# Patient Record
Sex: Female | Born: 1935
Health system: Southern US, Community
[De-identification: ages and names within clinical notes are randomized; demographics above are authoritative.]

## PROBLEM LIST (undated history)

## (undated) DIAGNOSIS — I10 Essential (primary) hypertension: Secondary | ICD-10-CM

## (undated) HISTORY — PX: CHOLECYSTECTOMY: SHX55

## (undated) HISTORY — PX: MASTECTOMY: SHX3

---

## 2005-06-22 ENCOUNTER — Ambulatory Visit: Payer: Self-pay | Admitting: Cardiology

## 2009-03-01 ENCOUNTER — Ambulatory Visit: Payer: Self-pay | Admitting: Cardiology

## 2009-04-02 ENCOUNTER — Ambulatory Visit: Payer: Self-pay | Admitting: Cardiology

## 2009-09-16 DIAGNOSIS — E785 Hyperlipidemia, unspecified: Secondary | ICD-10-CM

## 2009-09-16 DIAGNOSIS — E875 Hyperkalemia: Secondary | ICD-10-CM

## 2009-09-16 DIAGNOSIS — I10 Essential (primary) hypertension: Secondary | ICD-10-CM

## 2009-09-16 DIAGNOSIS — R002 Palpitations: Secondary | ICD-10-CM

## 2011-04-26 NOTE — Assessment & Plan Note (Signed)
[facility]                          [name] OFFICE NOTE   Michelle Sharp, Michelle Sharp                       MRN:          [id number]  DATE:04/02/2009                            DOB:          28-Feb-1926    PRIMARY CARDIOLOGIST:  Jonelle Sidle, MD   REASON FOR VISIT:  Post hospital followup.   Michelle Sharp was recently referred to [place] in consultation here at [facility], following presentation with new onset fluttering.  She had  been previously evaluated by [place] in 2005, at which time she was felt to  have either ectopic atrial tachycardia versus atypical flutter (2:1).   During this current evaluation, we recommended further evaluation as an  outpatient with a 2-D echocardiogram and a CardioNet monitor.  Echocardiography indicated preserved LVF, but with mild RV dysfunction  and mild pulmonary hypertension (RVSP 49 mmHg).   A CardioNet monitor was also completed, indicating normal sinus rhythm  with no obvious dysrhythmias.   Clinically, Michelle Sharp reports no further fluttering since discharge,  and otherwise states that she feels fine.   CURRENT MEDICATIONS:  Lopressor 100 mg q. a.m./50 mg q. p.m., aspirin 81  daily, Xanax 0.25 b.i.d., Aciphex and Exforge 350 daily.   PHYSICAL EXAMINATION:  VITAL SIGNS:  Blood pressure 166/81, pulse 60,  regular weight 155.6.  GENERAL:  An 77 year old female sitting upright in no distress.  HEENT:  Normocephalic, atraumatic.  NECK:  Palpable, bilateral carotid pulses without bruits; no JVD.  LUNGS:  Clear to auscultation in all fields.  HEART:  Regular rate and rhythm.  No significant murmurs.  No rubs.  ABDOMEN:  Benign.  EXTREMITIES:  No significant edema.  NEURO:  Alert and oriented.   IMPRESSION:  1. Status post tachy palpitations, resolved.      a.     History of ectopic atrial tachycardia versus atypical       flutter, in 2005.      b.     No documented dysrhythmia by current CardioNet  monitoring.  2. Normal left ventricular function.      a.     Mild right ventricular dysfunction.  3. Hypertension, controlled.  4. Mixed dyslipidemia.  5. Mildly decreased TSH.  6:  Mild hypokalemia.   PLAN:  1. No further cardiac workup indicated at this point in time.  As      before, Michelle Sharp has been instructed to take an additional 25 mg      tablet of Lopressor, in Michelle event she were to have persistent tachy      palpitations.  2. Check a repeat TSH level, as well as a baseline metabolic profile.      Michelle Sharp did have a recent TSH level which was mildly decreased      at 0.32.  She has no known history of hyperthyroidism.  3. Continue close monitoring and management of hypertension, per Dr.      Sherril Croon.  4. Schedule return clinic followup with myself and Dr. Diona Browner in 6      months, or sooner if needed.  Rozell Searing, PA-C  Electronically Signed      Jonelle Sidle, MD  Electronically Signed   GS/MedQ  DD: 04/02/2009  DT: 04/03/2009  Job #: [id number]   cc:   Doreen Beam, MD

## 2016-01-22 DIAGNOSIS — M1990 Unspecified osteoarthritis, unspecified site: Secondary | ICD-10-CM | POA: Diagnosis not present

## 2016-01-22 DIAGNOSIS — R600 Localized edema: Secondary | ICD-10-CM | POA: Diagnosis not present

## 2016-01-22 DIAGNOSIS — I10 Essential (primary) hypertension: Secondary | ICD-10-CM | POA: Diagnosis not present

## 2016-01-22 DIAGNOSIS — F0390 Unspecified dementia without behavioral disturbance: Secondary | ICD-10-CM | POA: Diagnosis not present

## 2016-02-25 DIAGNOSIS — F0390 Unspecified dementia without behavioral disturbance: Secondary | ICD-10-CM | POA: Diagnosis not present

## 2016-02-25 DIAGNOSIS — I10 Essential (primary) hypertension: Secondary | ICD-10-CM | POA: Diagnosis not present

## 2016-02-25 DIAGNOSIS — M7989 Other specified soft tissue disorders: Secondary | ICD-10-CM | POA: Diagnosis not present

## 2016-02-25 DIAGNOSIS — Z789 Other specified health status: Secondary | ICD-10-CM | POA: Diagnosis not present

## 2016-02-26 DIAGNOSIS — Z853 Personal history of malignant neoplasm of breast: Secondary | ICD-10-CM | POA: Diagnosis not present

## 2016-02-26 DIAGNOSIS — R600 Localized edema: Secondary | ICD-10-CM | POA: Diagnosis not present

## 2016-02-26 DIAGNOSIS — M79662 Pain in left lower leg: Secondary | ICD-10-CM | POA: Diagnosis not present

## 2016-02-26 DIAGNOSIS — M79605 Pain in left leg: Secondary | ICD-10-CM | POA: Diagnosis not present

## 2016-03-16 DIAGNOSIS — R600 Localized edema: Secondary | ICD-10-CM | POA: Diagnosis not present

## 2016-04-29 DIAGNOSIS — I10 Essential (primary) hypertension: Secondary | ICD-10-CM | POA: Diagnosis not present

## 2016-04-29 DIAGNOSIS — M159 Polyosteoarthritis, unspecified: Secondary | ICD-10-CM | POA: Diagnosis not present

## 2016-04-29 DIAGNOSIS — G309 Alzheimer's disease, unspecified: Secondary | ICD-10-CM | POA: Diagnosis not present

## 2016-05-20 DIAGNOSIS — L309 Dermatitis, unspecified: Secondary | ICD-10-CM | POA: Diagnosis not present

## 2016-05-20 DIAGNOSIS — M7989 Other specified soft tissue disorders: Secondary | ICD-10-CM | POA: Diagnosis not present

## 2016-05-20 DIAGNOSIS — R42 Dizziness and giddiness: Secondary | ICD-10-CM | POA: Diagnosis not present

## 2016-06-06 DIAGNOSIS — M159 Polyosteoarthritis, unspecified: Secondary | ICD-10-CM | POA: Diagnosis not present

## 2016-06-06 DIAGNOSIS — G309 Alzheimer's disease, unspecified: Secondary | ICD-10-CM | POA: Diagnosis not present

## 2016-06-06 DIAGNOSIS — I10 Essential (primary) hypertension: Secondary | ICD-10-CM | POA: Diagnosis not present

## 2016-06-22 DIAGNOSIS — Z6824 Body mass index (BMI) 24.0-24.9, adult: Secondary | ICD-10-CM | POA: Diagnosis not present

## 2016-06-22 DIAGNOSIS — M1990 Unspecified osteoarthritis, unspecified site: Secondary | ICD-10-CM | POA: Diagnosis not present

## 2016-06-22 DIAGNOSIS — Z299 Encounter for prophylactic measures, unspecified: Secondary | ICD-10-CM | POA: Diagnosis not present

## 2016-06-22 DIAGNOSIS — F0390 Unspecified dementia without behavioral disturbance: Secondary | ICD-10-CM | POA: Diagnosis not present

## 2016-06-22 DIAGNOSIS — I10 Essential (primary) hypertension: Secondary | ICD-10-CM | POA: Diagnosis not present

## 2016-06-29 DIAGNOSIS — G309 Alzheimer's disease, unspecified: Secondary | ICD-10-CM | POA: Diagnosis not present

## 2016-06-29 DIAGNOSIS — I10 Essential (primary) hypertension: Secondary | ICD-10-CM | POA: Diagnosis not present

## 2016-06-29 DIAGNOSIS — M159 Polyosteoarthritis, unspecified: Secondary | ICD-10-CM | POA: Diagnosis not present

## 2016-07-06 DIAGNOSIS — Z299 Encounter for prophylactic measures, unspecified: Secondary | ICD-10-CM | POA: Diagnosis not present

## 2016-07-06 DIAGNOSIS — Z1389 Encounter for screening for other disorder: Secondary | ICD-10-CM | POA: Diagnosis not present

## 2016-07-06 DIAGNOSIS — Z7189 Other specified counseling: Secondary | ICD-10-CM | POA: Diagnosis not present

## 2016-07-06 DIAGNOSIS — Z1211 Encounter for screening for malignant neoplasm of colon: Secondary | ICD-10-CM | POA: Diagnosis not present

## 2016-07-06 DIAGNOSIS — Z0000 Encounter for general adult medical examination without abnormal findings: Secondary | ICD-10-CM | POA: Diagnosis not present

## 2016-07-07 DIAGNOSIS — Z79899 Other long term (current) drug therapy: Secondary | ICD-10-CM | POA: Diagnosis not present

## 2016-07-07 DIAGNOSIS — R5383 Other fatigue: Secondary | ICD-10-CM | POA: Diagnosis not present

## 2016-07-07 DIAGNOSIS — E7800 Pure hypercholesterolemia, unspecified: Secondary | ICD-10-CM | POA: Diagnosis not present

## 2016-07-19 DIAGNOSIS — I10 Essential (primary) hypertension: Secondary | ICD-10-CM | POA: Diagnosis not present

## 2016-07-19 DIAGNOSIS — G309 Alzheimer's disease, unspecified: Secondary | ICD-10-CM | POA: Diagnosis not present

## 2016-07-19 DIAGNOSIS — M159 Polyosteoarthritis, unspecified: Secondary | ICD-10-CM | POA: Diagnosis not present

## 2016-09-06 DIAGNOSIS — G309 Alzheimer's disease, unspecified: Secondary | ICD-10-CM | POA: Diagnosis not present

## 2016-09-06 DIAGNOSIS — I10 Essential (primary) hypertension: Secondary | ICD-10-CM | POA: Diagnosis not present

## 2016-09-06 DIAGNOSIS — M159 Polyosteoarthritis, unspecified: Secondary | ICD-10-CM | POA: Diagnosis not present

## 2016-09-16 DIAGNOSIS — I10 Essential (primary) hypertension: Secondary | ICD-10-CM | POA: Diagnosis not present

## 2016-09-16 DIAGNOSIS — Z299 Encounter for prophylactic measures, unspecified: Secondary | ICD-10-CM | POA: Diagnosis not present

## 2016-09-16 DIAGNOSIS — F0390 Unspecified dementia without behavioral disturbance: Secondary | ICD-10-CM | POA: Diagnosis not present

## 2016-10-10 DIAGNOSIS — M159 Polyosteoarthritis, unspecified: Secondary | ICD-10-CM | POA: Diagnosis not present

## 2016-10-10 DIAGNOSIS — I10 Essential (primary) hypertension: Secondary | ICD-10-CM | POA: Diagnosis not present

## 2016-10-10 DIAGNOSIS — G309 Alzheimer's disease, unspecified: Secondary | ICD-10-CM | POA: Diagnosis not present

## 2016-11-30 DIAGNOSIS — G309 Alzheimer's disease, unspecified: Secondary | ICD-10-CM | POA: Diagnosis not present

## 2016-11-30 DIAGNOSIS — I10 Essential (primary) hypertension: Secondary | ICD-10-CM | POA: Diagnosis not present

## 2016-11-30 DIAGNOSIS — M159 Polyosteoarthritis, unspecified: Secondary | ICD-10-CM | POA: Diagnosis not present

## 2016-12-16 DIAGNOSIS — R5383 Other fatigue: Secondary | ICD-10-CM | POA: Diagnosis not present

## 2016-12-16 DIAGNOSIS — Z6825 Body mass index (BMI) 25.0-25.9, adult: Secondary | ICD-10-CM | POA: Diagnosis not present

## 2016-12-16 DIAGNOSIS — Z299 Encounter for prophylactic measures, unspecified: Secondary | ICD-10-CM | POA: Diagnosis not present

## 2016-12-16 DIAGNOSIS — I10 Essential (primary) hypertension: Secondary | ICD-10-CM | POA: Diagnosis not present

## 2016-12-16 DIAGNOSIS — M1710 Unilateral primary osteoarthritis, unspecified knee: Secondary | ICD-10-CM | POA: Diagnosis not present

## 2016-12-28 DIAGNOSIS — G309 Alzheimer's disease, unspecified: Secondary | ICD-10-CM | POA: Diagnosis not present

## 2016-12-28 DIAGNOSIS — I10 Essential (primary) hypertension: Secondary | ICD-10-CM | POA: Diagnosis not present

## 2016-12-28 DIAGNOSIS — M159 Polyosteoarthritis, unspecified: Secondary | ICD-10-CM | POA: Diagnosis not present

## 2017-02-09 DIAGNOSIS — Z299 Encounter for prophylactic measures, unspecified: Secondary | ICD-10-CM | POA: Diagnosis not present

## 2017-02-09 DIAGNOSIS — Z87891 Personal history of nicotine dependence: Secondary | ICD-10-CM | POA: Diagnosis not present

## 2017-02-09 DIAGNOSIS — R21 Rash and other nonspecific skin eruption: Secondary | ICD-10-CM | POA: Diagnosis not present

## 2017-02-09 DIAGNOSIS — E039 Hypothyroidism, unspecified: Secondary | ICD-10-CM | POA: Diagnosis not present

## 2017-02-09 DIAGNOSIS — Z713 Dietary counseling and surveillance: Secondary | ICD-10-CM | POA: Diagnosis not present

## 2017-02-09 DIAGNOSIS — E663 Overweight: Secondary | ICD-10-CM | POA: Diagnosis not present

## 2017-02-09 DIAGNOSIS — M545 Low back pain: Secondary | ICD-10-CM | POA: Diagnosis not present

## 2017-02-09 DIAGNOSIS — R5383 Other fatigue: Secondary | ICD-10-CM | POA: Diagnosis not present

## 2017-02-09 DIAGNOSIS — I10 Essential (primary) hypertension: Secondary | ICD-10-CM | POA: Diagnosis not present

## 2017-02-25 DIAGNOSIS — I214 Non-ST elevation (NSTEMI) myocardial infarction: Secondary | ICD-10-CM | POA: Diagnosis not present

## 2017-02-25 DIAGNOSIS — Z853 Personal history of malignant neoplasm of breast: Secondary | ICD-10-CM | POA: Diagnosis not present

## 2017-02-25 DIAGNOSIS — R7989 Other specified abnormal findings of blood chemistry: Secondary | ICD-10-CM | POA: Diagnosis not present

## 2017-02-25 DIAGNOSIS — Z79899 Other long term (current) drug therapy: Secondary | ICD-10-CM | POA: Diagnosis not present

## 2017-02-25 DIAGNOSIS — R109 Unspecified abdominal pain: Secondary | ICD-10-CM | POA: Diagnosis not present

## 2017-02-25 DIAGNOSIS — R252 Cramp and spasm: Secondary | ICD-10-CM | POA: Diagnosis not present

## 2017-02-25 DIAGNOSIS — Z7982 Long term (current) use of aspirin: Secondary | ICD-10-CM | POA: Diagnosis not present

## 2017-02-25 DIAGNOSIS — K219 Gastro-esophageal reflux disease without esophagitis: Secondary | ICD-10-CM | POA: Diagnosis not present

## 2017-02-25 DIAGNOSIS — D72829 Elevated white blood cell count, unspecified: Secondary | ICD-10-CM | POA: Diagnosis not present

## 2017-02-25 DIAGNOSIS — R918 Other nonspecific abnormal finding of lung field: Secondary | ICD-10-CM | POA: Diagnosis not present

## 2017-02-25 DIAGNOSIS — Z9011 Acquired absence of right breast and nipple: Secondary | ICD-10-CM | POA: Diagnosis not present

## 2017-02-25 DIAGNOSIS — I10 Essential (primary) hypertension: Secondary | ICD-10-CM | POA: Diagnosis not present

## 2017-02-25 DIAGNOSIS — Z9049 Acquired absence of other specified parts of digestive tract: Secondary | ICD-10-CM | POA: Diagnosis not present

## 2017-02-25 DIAGNOSIS — F0390 Unspecified dementia without behavioral disturbance: Secondary | ICD-10-CM | POA: Diagnosis not present

## 2017-02-25 DIAGNOSIS — R072 Precordial pain: Secondary | ICD-10-CM | POA: Diagnosis not present

## 2017-02-26 ENCOUNTER — Inpatient Hospital Stay (HOSPITAL_COMMUNITY)
Admission: AD | Admit: 2017-02-26 | Discharge: 2017-03-02 | DRG: 282 | Disposition: A | Discharge status: Home Health | Payer: Medicare Other | Source: Other Acute Inpatient Hospital | Attending: Cardiovascular Disease | Admitting: Cardiovascular Disease

## 2017-02-26 DIAGNOSIS — Z66 Do not resuscitate: Secondary | ICD-10-CM | POA: Diagnosis present

## 2017-02-26 DIAGNOSIS — K219 Gastro-esophageal reflux disease without esophagitis: Secondary | ICD-10-CM | POA: Diagnosis present

## 2017-02-26 DIAGNOSIS — R000 Tachycardia, unspecified: Secondary | ICD-10-CM | POA: Diagnosis not present

## 2017-02-26 DIAGNOSIS — Z79899 Other long term (current) drug therapy: Secondary | ICD-10-CM

## 2017-02-26 DIAGNOSIS — I10 Essential (primary) hypertension: Secondary | ICD-10-CM | POA: Diagnosis present

## 2017-02-26 DIAGNOSIS — F0390 Unspecified dementia without behavioral disturbance: Secondary | ICD-10-CM | POA: Diagnosis present

## 2017-02-26 DIAGNOSIS — R079 Chest pain, unspecified: Secondary | ICD-10-CM | POA: Diagnosis not present

## 2017-02-26 DIAGNOSIS — I214 Non-ST elevation (NSTEMI) myocardial infarction: Secondary | ICD-10-CM | POA: Diagnosis present

## 2017-02-26 DIAGNOSIS — R918 Other nonspecific abnormal finding of lung field: Secondary | ICD-10-CM | POA: Diagnosis present

## 2017-02-26 DIAGNOSIS — D72829 Elevated white blood cell count, unspecified: Secondary | ICD-10-CM | POA: Diagnosis not present

## 2017-02-26 HISTORY — DX: Essential (primary) hypertension: I10

## 2017-02-26 MED ORDER — ACETAMINOPHEN 325 MG PO TABS: 650.0000 mg | ORAL_TABLET | ORAL | Status: DC | PRN

## 2017-02-26 MED ORDER — ASPIRIN EC 81 MG PO TBEC
81.0000 mg | DELAYED_RELEASE_TABLET | Freq: Every day | ORAL | Status: DC
  Administered 2017-02-27 – 2017-03-02 (×4): 81 mg via ORAL
  Filled 2017-02-26 (×4): qty 1

## 2017-02-26 MED ORDER — ONDANSETRON HCL 4 MG/2ML IJ SOLN: 4.0000 mg | Freq: Four times a day (QID) | INTRAMUSCULAR | Status: DC | PRN

## 2017-02-26 MED ORDER — METOPROLOL TARTRATE 12.5 MG HALF TABLET
12.5000 mg | ORAL_TABLET | Freq: Two times a day (BID) | ORAL | Status: DC
  Administered 2017-02-26: 12.5 mg via ORAL
  Filled 2017-02-26: qty 1

## 2017-02-26 MED ORDER — NITROGLYCERIN 0.4 MG SL SUBL: 0.4000 mg | SUBLINGUAL_TABLET | SUBLINGUAL | Status: DC | PRN

## 2017-02-26 MED ORDER — RISPERIDONE 0.5 MG PO TBDP
0.2500 mg | ORAL_TABLET | Freq: Every day | ORAL | Status: DC
  Administered 2017-02-26 – 2017-03-01 (×4): 0.25 mg via ORAL
  Filled 2017-02-26 (×6): qty 0.5

## 2017-02-26 MED ORDER — ATORVASTATIN CALCIUM 40 MG PO TABS
40.0000 mg | ORAL_TABLET | Freq: Every day | ORAL | Status: DC
  Administered 2017-02-27 – 2017-03-01 (×3): 40 mg via ORAL
  Filled 2017-02-26 (×3): qty 1

## 2017-02-26 MED ORDER — PANTOPRAZOLE SODIUM 20 MG PO TBEC
20.0000 mg | DELAYED_RELEASE_TABLET | Freq: Every day | ORAL | Status: DC
  Administered 2017-02-27 – 2017-03-02 (×4): 20 mg via ORAL
  Filled 2017-02-26 (×4): qty 1

## 2017-02-26 NOTE — H&P (Signed)
Patient ID: Michelle Sharp MRN: [id number], DOB/AGE: 81/02/1926   Admit date: 02/26/2017  Requesting Physician: Primary Physician: Pcp Not In System Primary Cardiologist: N/A Reason for admission: NSTEMI  HPI:  Michelle Sharp is a 83 y.o. female with a history significant for HTN, GERD, and dementia who was accepted by Dr. Chari ManningMcneal as a transfer [facility] of [facility].  Michelle Sharp is very pleasant but her baseline dementia does not allow her to answer questions about her presentation right now, she does not know where she is or why she is here.  Unfortunately family is not at the bedside either, and so what follows was obtained via review of OSH documentation.  The pt presented to the OSH after reporting to her family that she was experiencing mild chest pain that was radiating to her back and shoulder.  She was found to be hemodynamically stable on arrival there and CT of the chest r/o dissection, but the physicians evaluating her were concerned for possible ischemic changes on her ECG and initial troponin was mildly elevated so medical management of NSTEMI with ASA and enoxaparin was started.  Troponin subsequently rose to 0.46 and the family decided that they would like to pursue coronary angiography if offered and so the pt was transferred here for further evaluation.  She is currently laying comfortably in bed and has no acute complaints.   Problem List  PMH as per above  Allergies  Allergies not on file   Home Medications  Per review of OSH documentation Risperidone 0.25mg  PO QHS Diclofenac 50mg  PO BID Omeprazole 20mg  PO QDAY Metoprolol 100mg  PO QDAY Amlodipine 5mg  PO QDAY Valsartan 320mg  PO QDAY  Prior to Admission medications   Not on File    Family History  Unable to obtain given mental status  No family status information on file.     Social History  Social History   Social History  . Marital status: Widowed    Spouse name: N/A  .  Number of children: N/A  . Years of education: N/A   Occupational History  . Not on file.   Social History Main Topics  . Smoking status: Not on file  . Smokeless tobacco: Not on file  . Alcohol use Not on file  . Drug use: Unknown  . Sexual activity: Not on file   Other Topics Concern  . Not on file   Social History Narrative  . No narrative on file     Review of Systems General:  No chills, fever, night sweats or weight changes.  Cardiovascular:  As per above Dermatological: No rash, lesions/masses Respiratory: No cough, dyspnea Urologic: No hematuria, dysuria Abdominal:   No nausea, vomiting, diarrhea, bright red blood per rectum, melena, or hematemesis Neurologic:  No visual changes, wkns, changes in mental status. All other systems reviewed and are otherwise negative except as noted above.  Physical Exam  Blood pressure (!) 122/44, temperature 97.4 F (36.3 C), temperature source Oral, resp. rate (!) 25, height 5\' 4"  (1.626 m), weight 59.4 kg (131 lb), SpO2 99 %.  General: Pleasant, NAD Psych: Normal affect. Neuro: oriented only to person  HEENT: Normal  Neck: Supple without bruits or JVD. Lungs:  Resp regular and unlabored, CTA. Heart: RRR, +S1 +S2, II/VI mid-peaking SEM at the LUSB without radiation to the carotids and without a diastolic component, no apical murmurs, no LE edema Abdomen: Soft, non-tender, non-distended, BS + x 4.  Extremities: No clubbing, cyanosis  or edema. DP/PT/Radials 2+ and equal bilaterally.  Labs  No results for input(s): CKTOTAL, CKMB, TROPONINI in the last 72 hours. No results found for: WBC, HGB, HCT, MCV, PLT No results for input(s): NA, K, CL, CO2, BUN, CREATININE, CALCIUM, PROT, BILITOT, ALKPHOS, ALT, AST, GLUCOSE in the last 168 hours.  Invalid input(s): LABALBU No results found for: CHOL, HDL, LDLCALC, TRIG No results found for: DDIMER   Radiology/Studies  No results found.  ECG: Per my review, initial OSH ECG shows NSR  with normal axis and normal intervals with 2mm ST depression V3-V5 and TWI V2-V6, this changes were no longer present on tracing obtained 12 hours later.  ASSESSMENT AND PLAN  The pt is an 83 y.o. female with a history significant for HTN, GERD, and dementia who was accepted by Dr. Chari Manning as a transfer to [facility] for management of NSTEMI.  The pt is [facility] per documentation and has clear baseline dementia, but per report is fairly independent at home.  If her family wishes to pursue invasive management this may be reasonable given dynamic ECG changes and mildly elevated troponin.  That being said she may do equally well with medical management only and so this decision can be deferred until the morning when family is present to discuss.  # NSTEMI; positive cardiac enzymes with dynamic ECG changes that resolved with resolution of CP - ASA 81mg  PO QDAY - treatment dose enoxaparin - defer P2Y12 inhibition for now - atorvastatin 40mg  PO QDAY - metoprolol 12.5mg  PO BID, HR currently in the 60s and per OSH documentation this was the dosing she had received prior to transfer - repeat ECG if CP recurs - trend troponin until peak - TTE in am, of note her exam seems consistent with mild AS - continuous telemetry - NPO at midnight for possible cath in am  # HTN; outpatient regimen per documentation includes metoprolol, valsartan, and amlodipine. SBP here is in the 120s with widened pulse pressure and per OSH documentation ARB and CCB had not been given - continue metoprolol as per above - hold amlodipine and valsartan for now - OSH CT without evidence of dissection and pulses equal bilaterally on exam, no diastolic rumble heard on exam to suggest AI - TTE in am  # Dementia - restart home risperidone - family to arrive in am  # GERD - pantoprazole   Signed, Azalee Course, MD 02/26/2017, 10:57 PM

## 2017-02-27 DIAGNOSIS — I214 Non-ST elevation (NSTEMI) myocardial infarction: Principal | ICD-10-CM

## 2017-02-27 DIAGNOSIS — F0390 Unspecified dementia without behavioral disturbance: Secondary | ICD-10-CM | POA: Diagnosis present

## 2017-02-27 LAB — LIPID PANEL
CHOLESTEROL: 157 mg/dL (ref 0–200)
HDL: 44 mg/dL (ref >40)
LDL Cholesterol: 88 mg/dL (ref 0–99)
TRIGLYCERIDES: 125 mg/dL (ref <150)
Total CHOL/HDL Ratio: 3.6 RATIO
VLDL: 25 mg/dL (ref 0–40)

## 2017-02-27 LAB — BASIC METABOLIC PANEL
ANION GAP: 7 (ref 5–15)
BUN: 10 mg/dL (ref 6–20)
CALCIUM: 8.6 mg/dL — AB (ref 8.9–10.3)
CO2: 26 mmol/L (ref 22–32)
CREATININE: 0.98 mg/dL (ref 0.44–1.00)
Chloride: 109 mmol/L (ref 101–111)
GFR, EST AFRICAN AMERICAN: 58 mL/min — AB (ref >60)
GFR, EST NON AFRICAN AMERICAN: 50 mL/min — AB (ref >60)
Glucose, Bld: 90 mg/dL (ref 65–99)
Potassium: 3.7 mmol/L (ref 3.5–5.1)
SODIUM: 142 mmol/L (ref 135–145)

## 2017-02-27 LAB — CBC
HCT: 32.1 % — ABNORMAL LOW (ref 36.0–46.0)
Hemoglobin: 10.3 g/dL — ABNORMAL LOW (ref 12.0–15.0)
MCH: 28.8 pg (ref 26.0–34.0)
MCHC: 32.1 g/dL (ref 30.0–36.0)
MCV: 89.7 fL (ref 78.0–100.0)
PLATELETS: 190 10*3/uL (ref 150–400)
RBC: 3.58 MIL/uL — AB (ref 3.87–5.11)
RDW: 13.8 % (ref 11.5–15.5)
WBC: 7.2 10*3/uL (ref 4.0–10.5)

## 2017-02-27 LAB — TROPONIN I
TROPONIN I: 10.06 ng/mL — AB (ref <0.03)
TROPONIN I: 10.9 ng/mL — AB (ref <0.03)
Troponin I: 5.01 ng/mL (ref <0.03)

## 2017-02-27 LAB — PROTIME-INR
INR: 1.2
PROTHROMBIN TIME: 15.2 s (ref 11.4–15.2)

## 2017-02-27 MED ORDER — ENOXAPARIN SODIUM 60 MG/0.6ML ~~LOC~~ SOLN
60.0000 mg | Freq: Two times a day (BID) | SUBCUTANEOUS | Status: DC
  Administered 2017-02-27 – 2017-02-28 (×4): 60 mg via SUBCUTANEOUS
  Filled 2017-02-27 (×4): qty 0.6

## 2017-02-27 MED ORDER — CLOPIDOGREL BISULFATE 75 MG PO TABS
75.0000 mg | ORAL_TABLET | Freq: Every day | ORAL | Status: DC
  Administered 2017-02-27 – 2017-03-02 (×4): 75 mg via ORAL
  Filled 2017-02-27 (×4): qty 1

## 2017-02-27 MED ORDER — ALPRAZOLAM 0.25 MG PO TABS
0.2500 mg | ORAL_TABLET | Freq: Two times a day (BID) | ORAL | Status: DC | PRN
  Administered 2017-02-27 – 2017-03-02 (×3): 0.25 mg via ORAL
  Filled 2017-02-27 (×4): qty 1

## 2017-02-27 MED ORDER — METOPROLOL TARTRATE 12.5 MG HALF TABLET
12.5000 mg | ORAL_TABLET | Freq: Two times a day (BID) | ORAL | Status: DC
  Administered 2017-02-27 – 2017-02-28 (×4): 12.5 mg via ORAL
  Filled 2017-02-27 (×5): qty 1

## 2017-02-27 NOTE — Progress Notes (Addendum)
Progress Note  Patient Name: Michelle Sharp Date of Encounter: 02/27/2017  Primary Cardiologist: Dr Diona BrownerMcDowell (seen in '05)  Subjective   Awake and alert- no chest or back pain this am.  Inpatient Medications    Scheduled Meds: . aspirin EC  81 mg Oral Daily  . atorvastatin  40 mg Oral q1800  . enoxaparin (LOVENOX) injection  60 mg Subcutaneous Q12H  . metoprolol tartrate  12.5 mg Oral BID  . pantoprazole  20 mg Oral Daily  . risperiDONE  0.25 mg Oral QHS   Continuous Infusions:  PRN Meds: acetaminophen, ALPRAZolam, nitroGLYCERIN, ondansetron (ZOFRAN) IV   Vital Signs    Vitals:   02/26/17 2100 02/27/17 0550  BP: (!) 122/44 110/80  Pulse: 72 72  Resp: (!) 25 16  Temp: 97.4 F (36.3 C) 98.2 F (36.8 C)  TempSrc: Oral Oral  SpO2: 99% 96%  Weight: 131 lb (59.4 kg) 131 lb 2.8 oz (59.5 kg)  Height: 5\' 4"  (1.626 m)     Intake/Output Summary (Last 24 hours) at 02/27/17 0831 Last data filed at 02/26/17 2300  Gross per 24 hour  Intake              120 ml  Output                0 ml  Net              120 ml   Filed Weights   02/26/17 2100 02/27/17 0550  Weight: 131 lb (59.4 kg) 131 lb 2.8 oz (59.5 kg)    Telemetry    NSR - Personally Reviewed  ECG    pending - Personally Reviewed  Physical Exam   GEN: No acute distress.   Neck: No JVD, RCA bruit Cardiac: RRR, no murmurs, rubs, or gallops. S4 Respiratory: Clear to auscultation bilaterally. GI: Soft, nontender, non-distended  MS: No edema; No deformity. Neuro:  Nonfocal  Psych: Normal affect   Labs    Chemistry Recent Labs Lab 02/27/17 0511  NA 142  K 3.7  CL 109  CO2 26  GLUCOSE 90  BUN 10  CREATININE 0.98  CALCIUM 8.6*  GFRNONAA 50*  GFRAA 58*  ANIONGAP 7     Hematology Recent Labs Lab 02/27/17 0511  WBC 7.2  RBC 3.58*  HGB 10.3*  HCT 32.1*  MCV 89.7  MCH 28.8  MCHC 32.1  RDW 13.8  PLT 190    Cardiac Enzymes Recent Labs Lab 02/26/17 2315 02/27/17 0511  TROPONINI  10.06* 10.90*   No results for input(s): TROPIPOC in the last 168 hours.   Radiology    [facility] multiple pulmonary nodules-f/u recommended 3-6 months  Cardiac Studies   [facility] NSR 62 with diffuse TWI- 1,2,V2-V6  Patient Profile     83 y.o. female from [place] with a history of dementia. She had seen Dr Diona BrownerMcDowell in 2005 for palpitations. [facility] with chest pain that radiated to her back. History obtained through the pt's family. She lives in her own home with her son but the history was provided by other family members present. The pt is ambulatory in her home and able to take care of her personal needs. The family has notice some DOE but no other symptoms.   Assessment & Plan    NSTEMI- Troponin 10.9  Dementia- she is ambulatory and able to care for herself.  Pulmonary nodules- incidental finding on chest CT at  [facility]  Plan- check echo, EKG. If her LVF is normal consider conservative Rx. ASA, statin, low dose beta blocker, full dose Lovenox added.   Signed, Corine Shelter, PA-C  02/27/2017, 8:31 AM    As above, patient seen and examined. Briefly she is an 83 year old female transferred from [facility] with non-ST elevation myocardial infarction. At present she is pain-free. She is 83 years old and has dementia. Her son helps care for her. I had a long discussion with she and her son, sister and niece. I reviewed options of medical therapy versus proceeding with cardiac catheterization. They have elected conservative measures given her overall medical condition. They do not want to proceed with catheterization at this point but would consider for recurrent symptoms. I explained the risk of recurrent myocardial infarction with undiagnosed coronary disease. Plan to continue aspirin, statin, metoprolol. Continue Lovenox for a total of 48 hours and then DC if she remains pain free. Add Plavix 75 mg daily. Schedule echocardiogram  to assess LV function. We will plan to discharge and 72 hours if she remains stable with outpatient follow-up. She will need follow-up chest CT for nodules.  Olga Millers, MD

## 2017-02-27 NOTE — Progress Notes (Signed)
ANTICOAGULATION CONSULT NOTE - Initial Consult  Pharmacy Consult for Lovenox Indication: chest pain/ACS  Allergies not on file  Patient Measurements: Height: 5\' 4"  (162.6 cm) Weight: 131 lb (59.4 kg) IBW/kg (Calculated) : 54.7  Vital Signs: Temp: 97.4 F (36.3 C) (03/18 2100) Temp Source: Oral (03/18 2100) BP: 122/44 (03/18 2100)  Labs:  Recent Labs  02/26/17 2315  TROPONINI 10.06*    CrCl cannot be calculated (No order found.).   Medical History: No past medical history on file.  Assessment: 83 y.o. female with chest pain/elevated cardiac markers for Lovenox.  Transfer[facility] where received Lovenox 60 mg SQ q12h, last dose at 2000.    Goal of Therapy:  Full anticoagulation with Lovenox Monitor platelets by anticoagulation protocol: Yes   Plan:  Lovenox 60 mg SQ 12h  Michelle Sharp, Michelle Sharp 02/27/2017,12:59 AM

## 2017-02-27 NOTE — Progress Notes (Signed)
CRITICAL VALUE ALERT  Critical value received:  Troponin  Date of notification:  02/27/2017  Time of notification:  1220  Critical value read back: yes  Nurse who received alert:  Threasa AlphaS Detroit Frieden  MD notified (1st page): Corotto  Time of first page:  1220  MD notified (2nd page):   Time of second page:  Responding MD:  Corotto  Time MD responded:  1224

## 2017-02-28 ENCOUNTER — Inpatient Hospital Stay (HOSPITAL_COMMUNITY): Payer: Medicare Other

## 2017-02-28 ENCOUNTER — Encounter (HOSPITAL_COMMUNITY): Payer: Self-pay | Admitting: *Deleted

## 2017-02-28 LAB — CBC
HEMATOCRIT: 33 % — AB (ref 36.0–46.0)
Hemoglobin: 10.5 g/dL — ABNORMAL LOW (ref 12.0–15.0)
MCH: 28.7 pg (ref 26.0–34.0)
MCHC: 31.8 g/dL (ref 30.0–36.0)
MCV: 90.2 fL (ref 78.0–100.0)
Platelets: 185 10*3/uL (ref 150–400)
RBC: 3.66 MIL/uL — ABNORMAL LOW (ref 3.87–5.11)
RDW: 13.9 % (ref 11.5–15.5)
WBC: 7.1 10*3/uL (ref 4.0–10.5)

## 2017-02-28 LAB — HEMOGLOBIN A1C
Hgb A1c MFr Bld: 5.3 % (ref 4.8–5.6)
Mean Plasma Glucose: 105 mg/dL

## 2017-02-28 NOTE — Progress Notes (Signed)
CARDIAC REHAB PHASE I   PRE:  Rate/Rhythm: 88 SR    BP: sitting 133/66    SaO2:   MODE:  Ambulation: 200 ft   POST:  Rate/Rhythm: 101 ST    BP: sitting 137/69     SaO2:   Pt got out of bed and stood without assist however had posterior lean at first with standing. Gave her RW to walk in hall. Fairly steady. Had to rest after 75 ft due to SOB. Denied CP or back pain. Able to walk rest of 200 ft. VSS. She would benefit from RW at home, although she admits she does not like to use one. Also would benefit from HHPT to ensure safety with her environment. She admittedly likes to be active and walk the halls x3 qd. Lives with son. She is not appropriate for ed at this time. Will await her family tomorrow to do light education.  [id number]   Harriet MassonRandi Kristan Eivan Gallina CES, ACSM 02/28/2017 2:40 PM

## 2017-02-28 NOTE — Progress Notes (Signed)
Progress Note  Patient Name: Michelle Sharp Date of Encounter: 02/28/2017  Primary Cardiologist: Dr Diona Browner (seen in '05)  Subjective   Mild dyspnea; no chest pain  Inpatient Medications    Scheduled Meds: . aspirin EC  81 mg Oral Daily  . atorvastatin  40 mg Oral q1800  . clopidogrel  75 mg Oral Daily  . enoxaparin (LOVENOX) injection  60 mg Subcutaneous Q12H  . metoprolol tartrate  12.5 mg Oral BID  . pantoprazole  20 mg Oral Daily  . risperiDONE  0.25 mg Oral QHS   Continuous Infusions:  PRN Meds: acetaminophen, ALPRAZolam, nitroGLYCERIN, ondansetron (ZOFRAN) IV   Vital Signs    Vitals:   02/26/17 2100 02/27/17 0550 02/27/17 1413 02/27/17 2019  BP: (!) 122/44 110/80 (!) 110/54 134/77  Pulse: 72 72 66 92  Resp: (!) 25 16 18    Temp: 97.4 F (36.3 C) 98.2 F (36.8 C) 98.2 F (36.8 C) 98.2 F (36.8 C)  TempSrc: Oral Oral Axillary Oral  SpO2: 99% 96% 98%   Weight: 131 lb (59.4 kg) 131 lb 2.8 oz (59.5 kg)    Height: 5\' 4"  (1.626 m)       Intake/Output Summary (Last 24 hours) at 02/28/17 0910 Last data filed at 02/27/17 1801  Gross per 24 hour  Intake              800 ml  Output                0 ml  Net              800 ml   Filed Weights   02/26/17 2100 02/27/17 0550  Weight: 131 lb (59.4 kg) 131 lb 2.8 oz (59.5 kg)    Telemetry    NSR - Personally Reviewed  Physical Exam   GEN: No acute distress.   Neck: Supple Cardiac: RRR Respiratory: CTA GI: Soft, nontender, non-distended  MS: No edema Neuro:  Grossly intact  Labs    Chemistry  Recent Labs Lab 02/27/17 0511  NA 142  K 3.7  CL 109  CO2 26  GLUCOSE 90  BUN 10  CREATININE 0.98  CALCIUM 8.6*  GFRNONAA 50*  GFRAA 58*  ANIONGAP 7     Hematology  Recent Labs Lab 02/27/17 0511 02/28/17 0730  WBC 7.2 7.1  RBC 3.58* 3.66*  HGB 10.3* 10.5*  HCT 32.1* 33.0*  MCV 89.7 90.2  MCH 28.8 28.7  MCHC 32.1 31.8  RDW 13.8 13.9  PLT 190 185    Cardiac Enzymes  Recent  Labs Lab 02/26/17 2315 02/27/17 0511 02/27/17 1223  TROPONINI 10.06* 10.90* 5.01*    Radiology    CT scan at [facility]- multiple pulmonary nodules-f/u recommended 3-6 months  Cardiac Studies   EKG from [facility]- NSR 62 with diffuse TWI- 1,2,V2-V6  Patient Profile     83 y.o. female from [place] with a history of dementia. She had seen Dr Diona Browner in 2005 for palpitations. She presented to the [facility]/27/16 with chest pain that radiated to her back. History obtained through the pt's family. She lives in her own home with her son but the history was provided by other family members present. The pt is ambulatory in her home and able to take care of her personal needs. The family has notice some DOE but no other symptoms.   Assessment & Plan    NSTEMI- As outlined in previous note patient has elected medical therapy and avoid catheterization  if possible. This can be reconsidered if she has recurrent symptoms. The risks of undiagnosed coronary disease were discussed yesterday. We'll continue with aspirin, Plavix, statin and metoprolol. Continue Lovenox today and discontinue tomorrow. Ambulate today to see if she has recurrent symptoms. Schedule echocardiogram to assess LV function.   Dementia- she is ambulatory and able to care for herself.  Pulmonary nodules- [facility]; she will need follow-up CT with her primary care physician.  Signed, Olga MillersBrian Yessenia Maillet, MD  02/28/2017, 9:10 AM

## 2017-03-01 ENCOUNTER — Inpatient Hospital Stay (HOSPITAL_COMMUNITY): Payer: Medicare Other

## 2017-03-01 LAB — CBC
HEMATOCRIT: 33 % — AB (ref 36.0–46.0)
Hemoglobin: 10.6 g/dL — ABNORMAL LOW (ref 12.0–15.0)
MCH: 28.9 pg (ref 26.0–34.0)
MCHC: 32.1 g/dL (ref 30.0–36.0)
MCV: 89.9 fL (ref 78.0–100.0)
PLATELETS: 199 10*3/uL (ref 150–400)
RBC: 3.67 MIL/uL — ABNORMAL LOW (ref 3.87–5.11)
RDW: 13.7 % (ref 11.5–15.5)
WBC: 7.7 10*3/uL (ref 4.0–10.5)

## 2017-03-01 LAB — BASIC METABOLIC PANEL
Anion gap: 9 (ref 5–15)
BUN: 8 mg/dL (ref 6–20)
CHLORIDE: 107 mmol/L (ref 101–111)
CO2: 24 mmol/L (ref 22–32)
CREATININE: 0.88 mg/dL (ref 0.44–1.00)
Calcium: 8.6 mg/dL — ABNORMAL LOW (ref 8.9–10.3)
GFR calc Af Amer: >60 mL/min (ref >60)
GFR calc non Af Amer: 57 mL/min — ABNORMAL LOW (ref >60)
GLUCOSE: 92 mg/dL (ref 65–99)
POTASSIUM: 3.6 mmol/L (ref 3.5–5.1)
SODIUM: 140 mmol/L (ref 135–145)

## 2017-03-01 MED ORDER — ENOXAPARIN SODIUM 40 MG/0.4ML ~~LOC~~ SOLN
40.0000 mg | SUBCUTANEOUS | Status: DC
  Administered 2017-03-01: 40 mg via SUBCUTANEOUS
  Filled 2017-03-01: qty 0.4

## 2017-03-01 MED ORDER — METOPROLOL TARTRATE 25 MG PO TABS
25.0000 mg | ORAL_TABLET | Freq: Two times a day (BID) | ORAL | Status: DC
  Administered 2017-03-01 – 2017-03-02 (×3): 25 mg via ORAL
  Filled 2017-03-01 (×3): qty 1

## 2017-03-01 NOTE — Progress Notes (Signed)
CARDIAC REHAB PHASE I   PRE:  Rate/Rhythm: 1036-113 ST in bed    BP: lying 117/71, standing 130/77    SaO2:   MODE:  Ambulation: 36 ft   POST:  Rate/Rhythm: 133 ST with PACS    BP: sitting 145/72     SaO2:   Pts HR up in bed. No c/o willing to walk. BP stable with standing. HR increased with a few steps so did not walk far. Pt with look on her face, but not able to explain any c/o.  Return to bed as pts IV recently fell out. RN aware. On review, pt did not receive metroprolol today.  Will f/u tomorrow. Pt denied CP or back pain or even SOB. [id number]  Harriet MassonRandi Kristan Brayley Mackowiak CES, ACSM 03/01/2017 1:33 PM

## 2017-03-01 NOTE — Progress Notes (Signed)
Progress Note  Patient Name: Michelle Sharp Date of Encounter: 03/01/2017  Primary Cardiologist: Dr. Diona Browner (seen in 2005)  Subjective   Patient is feeling well; denies chest pain, SOB, and palpitations. Pt walked with PT yesterday and denied chest pain and back pain, but had to rest for brief SOB.  Inpatient Medications    Scheduled Meds: . aspirin EC  81 mg Oral Daily  . atorvastatin  40 mg Oral q1800  . clopidogrel  75 mg Oral Daily  . enoxaparin (LOVENOX) injection  60 mg Subcutaneous Q12H  . metoprolol tartrate  12.5 mg Oral BID  . pantoprazole  20 mg Oral Daily  . risperiDONE  0.25 mg Oral QHS   Continuous Infusions:  PRN Meds: acetaminophen, ALPRAZolam, nitroGLYCERIN, ondansetron (ZOFRAN) IV   Vital Signs    Vitals:   02/27/17 2019 02/28/17 1433 02/28/17 1938 03/01/17 0421  BP: 134/77 137/69 134/72 136/83  Pulse: 92 84 (!) 108 81  Resp:  18 16   Temp: 98.2 F (36.8 C) 98.6 F (37 C) 98.5 F (36.9 C) 98.6 F (37 C)  TempSrc: Oral Oral Oral Oral  SpO2:  100% 95% 96%  Weight:    128 lb 1.6 oz (58.1 kg)  Height:        Intake/Output Summary (Last 24 hours) at 03/01/17 0826 Last data filed at 02/28/17 2200  Gross per 24 hour  Intake              480 ml  Output                0 ml  Net              480 ml   Filed Weights   02/26/17 2100 02/27/17 0550 03/01/17 0421  Weight: 131 lb (59.4 kg) 131 lb 2.8 oz (59.5 kg) 128 lb 1.6 oz (58.1 kg)     Physical Exam   General: Well developed, well nourished, female appearing in no acute distress. Head: Normocephalic, atraumatic.  Neck: Supple without bruits, no JVD. Lungs:  Resp regular and unlabored, CTA. Heart: regular rhythm, tachycardic rate, S1, S2, no murmur; no rub. Abdomen: Soft, non-tender, non-distended with normoactive bowel sounds. No hepatomegaly. No rebound/guarding. No obvious abdominal masses. Extremities: No clubbing, cyanosis, No edema. Distal pedal pulses are 1+ bilaterally. Neuro:  Alert and oriented X 3. Moves all extremities spontaneously. Psych: Normal affect.  Labs    Chemistry Recent Labs Lab 02/27/17 0511 03/01/17 0530  NA 142 140  K 3.7 3.6  CL 109 107  CO2 26 24  GLUCOSE 90 92  BUN 10 8  CREATININE 0.98 0.88  CALCIUM 8.6* 8.6*  GFRNONAA 50* 57*  GFRAA 58* >60  ANIONGAP 7 9     Hematology Recent Labs Lab 02/27/17 0511 02/28/17 0730 03/01/17 0530  WBC 7.2 7.1 7.7  RBC 3.58* 3.66* 3.67*  HGB 10.3* 10.5* 10.6*  HCT 32.1* 33.0* 33.0*  MCV 89.7 90.2 89.9  MCH 28.8 28.7 28.9  MCHC 32.1 31.8 32.1  RDW 13.8 13.9 13.7  PLT 190 185 199    Cardiac Enzymes Recent Labs Lab 02/26/17 2315 02/27/17 0511 02/27/17 1223  TROPONINI 10.06* 10.90* 5.01*   No results for input(s): TROPIPOC in the last 168 hours.   BNPNo results for input(s): BNP, PROBNP in the last 168 hours.   DDimer No results for input(s): DDIMER in the last 168 hours.   Radiology    No results found.   Telemetry  NSR to Sinus Tachycardia in the 110s - Personally Reviewed  ECG    No new tracings - Personally Reviewed   Cardiac Studies   Echocardiogram 03/01/17: pending  Patient Profile     83 y.o. female from [place] with a history of dementia. She had seen Dr Diona BrownerMcDowell in 2005 for palpitations. [facility] [facility] with chest pain that radiated to her back. History obtained through the pt's family. She lives in her own home with her son but the history was provided by other family members present. The pt is ambulatory in her home and able to take care of her personal needs. The family has notice some DOE but no other symptoms.    Assessment & Plan    NSTEMI- As outlined in previous note patient has elected medical therapy and avoid catheterization if possible. This can be reconsidered if she has recurrent symptoms. The risks of undiagnosed coronary disease were discussed 02/27/17. We'll continue with aspirin, Plavix, statin and metoprolol.    - D/C'ed lovenox - Pt walked with PT, had to rest during walk for SOB but denied chest pain and back pain - echocardiogram to assess LV function pending   Sinus Tachycardia - telemetry shows HR in the 110s while in bed before morning dose of lopressor. If she continues in sinus tach, plan to increase lopressor to 25 mg BID  Dementia- she is ambulatory and able to care for herself; PT recommends HHPT and rolling walker  Pulmonary nodules- [facility]; she will need follow-up CT with her primary care physician.  Ambulatory status - pt denies chest pain with walking and is amendable to a rolling walker as PT has recommended. Pt walks 3-5 times daily at her apartment complex  Signed, Marcelino Dusterngela Nicole Duke , PA-C 8:26 AM 03/01/2017 Pager: [phone] As above, patient seen and examined. She denies any recurrent chest pain or dyspnea. As outlined previously we are treating conservatively at her request with medical therapy only. Will consider catheterization for recurrent symptoms. Continue aspirin, Plavix and statin. Increase metoprolol to 25 mg twice a day. Change Lovenox to DVT prophylaxis dosing. Ambulate today. Discharge tomorrow if patient is stable. We are awaiting echocardiogram to quantify LV function. Add ACE inhibitor if LV function reduced. She will need follow-up with primary care for her pulmonary nodules. Olga MillersBrian Crenshaw, MD

## 2017-03-02 ENCOUNTER — Inpatient Hospital Stay (HOSPITAL_COMMUNITY): Payer: Medicare Other

## 2017-03-02 DIAGNOSIS — R079 Chest pain, unspecified: Secondary | ICD-10-CM

## 2017-03-02 LAB — ECHOCARDIOGRAM COMPLETE
HEIGHTINCHES: 64 in
Weight: 2040 oz

## 2017-03-02 MED ORDER — CLOPIDOGREL BISULFATE 75 MG PO TABS: 75.0000 mg | ORAL_TABLET | Freq: Every day | ORAL | 11 refills | Status: DC

## 2017-03-02 MED ORDER — METOPROLOL TARTRATE 25 MG PO TABS
25.0000 mg | ORAL_TABLET | ORAL | 11 refills | Status: DC
Start: 2017-03-02 — End: 2017-03-02

## 2017-03-02 MED ORDER — METOPROLOL TARTRATE 25 MG PO TABS: 25.0000 mg | ORAL_TABLET | ORAL | 11 refills | Status: DC

## 2017-03-02 MED ORDER — METOPROLOL TARTRATE 25 MG PO TABS: 25.0000 mg | ORAL_TABLET | Freq: Two times a day (BID) | ORAL | 11 refills | Status: DC

## 2017-03-02 MED ORDER — PERFLUTREN LIPID MICROSPHERE
1.0000 mL | INTRAVENOUS | Status: AC | PRN
  Administered 2017-03-02: 2 mL via INTRAVENOUS
  Filled 2017-03-02 (×2): qty 10

## 2017-03-02 MED ORDER — PANTOPRAZOLE SODIUM 20 MG PO TBEC: 20.0000 mg | DELAYED_RELEASE_TABLET | Freq: Every day | ORAL | 11 refills | Status: DC

## 2017-03-02 MED ORDER — ASPIRIN 81 MG PO TBEC: 81.0000 mg | DELAYED_RELEASE_TABLET | Freq: Every day | ORAL | 11 refills | Status: DC

## 2017-03-02 MED ORDER — ATORVASTATIN CALCIUM 40 MG PO TABS: 40.0000 mg | ORAL_TABLET | Freq: Every day | ORAL | 11 refills | Status: DC

## 2017-03-02 MED ORDER — NITROGLYCERIN 0.4 MG SL SUBL: 0.4000 mg | SUBLINGUAL_TABLET | SUBLINGUAL | 12 refills | Status: DC | PRN

## 2017-03-02 NOTE — Discharge Summary (Signed)
Discharge Summary    Patient ID: Michelle Sharp,  MRN: [id number], DOB/AGE: 81/02/1926 81 y.o.  Admit date: 02/26/2017 Discharge date: 03/02/2017  Primary Care Provider: Pcp Not In System Primary Cardiologist: Dr Diona BrownerMcDowell (2005 [place])  Discharge Diagnoses    Principal Problem:   NSTEMI (non-ST elevated myocardial infarction) [facility](HCC) Active Problems:   Dementia   Allergies No Known Allergies  Diagnostic Studies/Procedures     ECHO: 03/02/2017 - Left ventricle: The cavity size was normal. Wall thickness was   increased in a pattern of mild LVH. Systolic function was normal.   The estimated ejection fraction was in the range of 60% to 65%.   Wall motion was normal; there were no regional wall motion   abnormalities. Doppler parameters are consistent with abnormal   left ventricular relaxation (grade 1 diastolic dysfunction). - Aortic valve: There was mild regurgitation. - Mitral valve: There was mild regurgitation. - Atrial septum: A patent foramen ovale cannot be excluded. - Tricuspid valve: There was moderate regurgitation. - Pulmonary arteries: PA peak pressure: 39 mm Hg (S). _____________   History of Present Illness     Michelle Sharp  Is an 83 y.o. female from [place] with a history of dementia. She had seen Dr Diona BrownerMcDowell in 2005 for palpitations. [facility] with chest pain that radiated to her back. History obtained through the pt's family. She lives in her own home with her son but the history was provided by other family members present. The pt is ambulatory in her home and able to take care of her personal needs. The family has notice some DOE but no other symptoms.   Hospital Course     Consultants: NONE   Pt had no more chest pain, but ruled in for a NSTEMI with a peak troponin of 10.90. She was on ASA, full-dose Lovenox, BB and statin. She was started on Plavix.   Discussion was held with pt and family regarding the plan. The  patient was very comfortable and preferred not to have an invasive evaluation. The decision was made to treat her medically, with no cath or ischemic evaluation planned. She is a DNR.  She was seen by cardiac rehab and ambulated with them. Her exercise tolerance is poor, but no worse than prior to admission. She would get SOB with exertion but had no chest pain.   Her BP meds were held on admission, her metoprolol was restarted at a lower dose. The Diovan and amlodipine were held. Her Kdur was held as well.   After 48 hours, the Lovenox was reduced to DVT strength. Her activity was increased without any chest pain. An echo was performed, results above. Her EF is preserved, with no WMA.  On 03/22, she was seen by Dr Jens Somrenshaw and all data were reviewed. No further inpatient workup was indicated and she is considered stable for discharge, to follow up as an outpatient.  She will remain off the Diovan and amlodipine, off the potassium and on a lower dose of metoprolol. [place], meds can be added back as appropriate. She is also to hold the Voltaren since she is on ASA/Plavix. Because of the Plavix, her omeprazole was changed to Protonix.  _____________  Discharge Vitals Blood pressure 112/82, pulse 73, temperature 98.1 F (36.7 C), temperature source Oral, resp. rate 15, height 5\' 4"  (1.626 m), weight 127 lb 8 oz (57.8 kg), SpO2 97 %.  Filed  Weights   02/27/17 0550 03/01/17 0421 03/02/17 0557  Weight: 131 lb 2.8 oz (59.5 kg) 128 lb 1.6 oz (58.1 kg) 127 lb 8 oz (57.8 kg)    Labs & Radiologic Studies    CBC  Recent Labs  02/28/17 0730 03/01/17 0530  WBC 7.1 7.7  HGB 10.5* 10.6*  HCT 33.0* 33.0*  MCV 90.2 89.9  PLT 185 199   Basic Metabolic Panel  Recent Labs  03/01/17 0530  NA 140  K 3.6  CL 107  CO2 24  GLUCOSE 92  BUN 8  CREATININE 0.88  CALCIUM 8.6*   Cardiac Enzymes Troponin I  Date Value Ref Range Status  02/27/2017 5.01 (HH) <0.03  ng/mL Final    Comment:    CRITICAL VALUE NOTED.  VALUE IS CONSISTENT WITH PREVIOUSLY REPORTED AND CALLED VALUE.  02/27/2017 10.90 (HH) <0.03 ng/mL Final    Comment:    CRITICAL VALUE NOTED.  VALUE IS CONSISTENT WITH PREVIOUSLY REPORTED AND CALLED VALUE.  02/26/2017 10.06 (HH) <0.03 ng/mL Final    Comment:    CRITICAL RESULT CALLED TO, READ BACK BY AND VERIFIED WITH: FLETCHER S,RN 02/27/17 0020 WAYK    _____________    Disposition   Pt is being discharged home today in good condition.  Follow-up Plans & Appointments     Discharge Instructions    Amb Referral to Cardiac Rehabilitation    Complete by:  As directed    Diagnosis:  NSTEMI      Discharge Medications   Current Discharge Medication List    CONTINUE these medications which have NOT CHANGED   Details  amLODipine (NORVASC) 5 MG tablet Take 5 mg by mouth daily.    diclofenac (VOLTAREN) 50 MG EC tablet Take 50 mg by mouth 2 (two) times daily.    donepezil (ARICEPT) 5 MG tablet Take 5 mg by mouth daily.    metoprolol (LOPRESSOR) 100 MG tablet Take 50-100 mg by mouth See admin instructions. Take 100 mg by mouth in the morning and take 50 mg by mouth at bedtime    omeprazole (PRILOSEC) 20 MG capsule Take 20 mg by mouth daily.    potassium chloride (K-DUR,KLOR-CON) 10 MEQ tablet Take 10 mEq by mouth daily.    valsartan (DIOVAN) 320 MG tablet Take 320 mg by mouth daily.         Aspirin prescribed at discharge?  Yes High Intensity Statin Prescribed? (Lipitor 40-80mg  or Crestor 20-40mg ): Yes Beta Blocker Prescribed? Yes For EF <40%, was ACEI/ARB Prescribed? No: n/a ADP Receptor Inhibitor Prescribed? (i.e. Plavix etc.-Includes Medically Managed Patients): Yes For EF <40%, Aldosterone Inhibitor Prescribed? No: n/a Was EF assessed during THIS hospitalization? Yes Was Cardiac Rehab II ordered? (Included Medically managed Patients): No: pt age and general frailty   Outstanding Labs/Studies   none  Duration of  Discharge Encounter   Greater than 30 minutes including physician time.  Melida Quitter NP 03/02/2017, 3:20 PM

## 2017-03-02 NOTE — Progress Notes (Signed)
Progress Note  Patient Name: Michelle Sharp Date of Encounter: 03/02/2017  Primary Cardiologist: Dr. Diona BrownerMcDowell (seen in 2005)  Subjective   Patient is feeling well; denies chest pain, SOB, and palpitations. Remembers walking yesterday (sort of), does not remember CP w/ ambulation.   Inpatient Medications    Scheduled Meds: . aspirin EC  81 mg Oral Daily  . atorvastatin  40 mg Oral q1800  . clopidogrel  75 mg Oral Daily  . enoxaparin  40 mg Subcutaneous Q24H  . metoprolol tartrate  25 mg Oral BID  . pantoprazole  20 mg Oral Daily  . risperiDONE  0.25 mg Oral QHS   Continuous Infusions:  PRN Meds: acetaminophen, ALPRAZolam, nitroGLYCERIN, ondansetron (ZOFRAN) IV, perflutren lipid microspheres (DEFINITY) IV suspension   Vital Signs    Vitals:   03/01/17 0421 03/01/17 1438 03/01/17 2134 03/02/17 0557  BP: 136/83 128/78 (!) 142/80 107/81  Pulse: 81 83 75 75  Resp:  15    Temp: 98.6 F (37 C) 98.1 F (36.7 C) 98.3 F (36.8 C) 98.3 F (36.8 C)  TempSrc: Oral Oral Oral Oral  SpO2: 96% 98% 98% 98%  Weight: 128 lb 1.6 oz (58.1 kg)   127 lb 8 oz (57.8 kg)  Height:        Intake/Output Summary (Last 24 hours) at 03/02/17 1048 Last data filed at 03/02/17 0900  Gross per 24 hour  Intake              720 ml  Output                0 ml  Net              720 ml   Filed Weights   02/27/17 0550 03/01/17 0421 03/02/17 0557  Weight: 131 lb 2.8 oz (59.5 kg) 128 lb 1.6 oz (58.1 kg) 127 lb 8 oz (57.8 kg)     Physical Exam   General: Well developed, well nourished, female appearing in no acute distress. Head: Normocephalic, atraumatic.  Neck: Supple without bruits, no JVD. Lungs:  Resp regular and unlabored, CTA. Heart: regular rhythm, tachycardic rate, S1, S2, no murmur; no rub. Abdomen: Soft, non-tender, non-distended with normoactive bowel sounds. No hepatomegaly. No rebound/guarding. No obvious abdominal masses. Extremities: No clubbing, cyanosis, No edema. Distal  pedal pulses are 2+ bilaterally. Neuro: Alert and oriented X 2. Moves all extremities spontaneously. Psych: Normal affect.  Labs    Chemistry  Recent Labs Lab 02/27/17 0511 03/01/17 0530  NA 142 140  K 3.7 3.6  CL 109 107  CO2 26 24  GLUCOSE 90 92  BUN 10 8  CREATININE 0.98 0.88  CALCIUM 8.6* 8.6*  GFRNONAA 50* 57*  GFRAA 58* >60  ANIONGAP 7 9     Hematology  Recent Labs Lab 02/27/17 0511 02/28/17 0730 03/01/17 0530  WBC 7.2 7.1 7.7  RBC 3.58* 3.66* 3.67*  HGB 10.3* 10.5* 10.6*  HCT 32.1* 33.0* 33.0*  MCV 89.7 90.2 89.9  MCH 28.8 28.7 28.9  MCHC 32.1 31.8 32.1  RDW 13.8 13.9 13.7  PLT 190 185 199    Cardiac Enzymes  Recent Labs Lab 02/26/17 2315 02/27/17 0511 02/27/17 1223  TROPONINI 10.06* 10.90* 5.01*         Radiology    No results found.   Telemetry    NSR without any more Sinus Tachycardia - Personally Reviewed  ECG    No new tracings - Personally Reviewed   Cardiac Studies  Echocardiogram 03/01/17: performed, results pending  Patient Profile     83 y.o. female from [place] with a history of dementia. She had seen Dr Diona Browner in 2005 for palpitations. She presented to the hospital in [facility] 02/27/16 with chest pain that radiated to her back. History obtained through the pt's family. She lives in her own home with her son but the history was provided by other family members present. The pt is ambulatory in her home and able to take care of her personal needs. The family has notice some DOE but no other symptoms.    Assessment & Plan    NSTEMI- As outlined in previous note patient has elected medical therapy and avoid catheterization if possible. This can be reconsidered if she has recurrent symptoms. The risks of undiagnosed coronary disease were discussed 02/27/17.  - pt is on aspirin, Plavix, statin and metoprolol.  - on DVT lovenox - Pt walked with cardiac rehab 03/21, did not go very far, but denied chest pain and DOE -  echocardiogram performed 03/22, results pending   Sinus Tachycardia - telemetry showed HR in the 110s while in bed before morning dose of lopressor on 03/21. -  Now, HR 60-70s at rest. SBP 107(early am)-142. - tolerating lopressor 25 mg BID  Dementia- she is ambulatory and able to care for herself; PT recommends HHPT and rolling walker  Pulmonary nodules- incidental finding on chest CT at [facility]; she will need follow-up CT with her primary care physician.   Ambulatory status - pt denies chest pain with walking and is amendable to a rolling walker as PT has recommended. Pt walks 3-5 times daily at her apartment complex  Plan: if echo ok and pt does well with ambulation, consider d/c later today.  Signed, Theodore Demark , PA-C 10:48 AM 03/02/2017  As above, patient seen and examined. She has not had any further chest pain and denies dyspnea. Echocardiogram shows normal LV function. We discussed previously and she preferred medical therapy and avoiding catheterization if possible. This can be considered in the future if she has recurrent symptoms. Continue aspirin, Plavix, statin and beta blocker. She will need follow-up with primary care for her pulmonary nodules. Discharged today and follow up with Dr. Diona Browner in [place] in 2-4 weeks.  > 30 min PA and physician time D2  Olga Millers, MD

## 2017-03-02 NOTE — Progress Notes (Signed)
CARDIAC REHAB PHASE I   PRE:  Rate/Rhythm: 67 SR    BP: sitting 147/94    SaO2:   MODE:  Ambulation: 300 ft   POST:  Rate/Rhythm: 87 SR    BP: sitting 149/80     SaO2: 97 RA  Tolerated fairly well. Min assist, no c/o CP or back pain. HR stable, no major SOB. Pt with inconsistent pace, walked slow then fast and back to slow. No LOB. Will have RW at home. Discussed MI, NTG, CP, and walking as tolerated. Did discuss CRPII and pt is more happy walking at home. Transport will be a problem as well however I will refer to [employer] CRPII. [id number]   Harriet MassonRandi Kristan Tressa Maldonado CES, ACSM 03/02/2017 12:28 PM

## 2017-03-02 NOTE — Care Management Important Message (Signed)
Important Message  Patient Details  Name: Michelle Sharp MRN: [id number] Date of Birth: 01/02/1926   Medicare Important Message Given:  Yes    Kyla BalzarineShealy, Carolene Gitto Abena 03/02/2017, 12:53 PM

## 2017-03-02 NOTE — Care Management Note (Signed)
Case Management Note  Patient Details  Name: Michelle HurryMolly I Limbert MRN: [id number] Date of Birth: 02/19/1926  Subjective/Objective: Pt presented for Nstemi. Plan for home with support of son. Pt in need of DME RW- [facility]                    Action/Plan: Pt requesting for PT evaluation and treatment for home- [facility] Referral made and SOC to begin within 24-48 hours post d/c. No further needs from CM at this time.   Expected Discharge Date:  03/02/17               Expected Discharge Plan:  Home w Home Health Services  In-House Referral:  NA  Discharge planning Services  CM Consult  Post Acute Care Choice:  Durable Medical Equipment, Home Health Choice offered to:  Patient  DME Arranged:  Walker rolling DME Agency:  [facility]  HH [facility]  [facility]  Status of Service:  Completed, signed off  If discussed at Long Length of Stay Meetings, dates discussed:    Additional Comments:  Gala LewandowskyGraves-Bigelow, Lavone Weisel Kaye, RN 03/02/2017, 4:30 PM

## 2017-03-02 NOTE — Progress Notes (Signed)
  Echocardiogram 2D Echocardiogram with Definity has been performed.  [name], [name] 03/02/2017, 10:09 AM

## 2017-03-02 NOTE — Progress Notes (Deleted)
Pt walking in hall on RA. Sats 86% while walking. Returned to 94 when resting in room. Cont to monitor. Emelda Brothershristy Alexah Kivett RN.

## 2017-03-04 DIAGNOSIS — F0280 Dementia in other diseases classified elsewhere without behavioral disturbance: Secondary | ICD-10-CM | POA: Diagnosis not present

## 2017-03-04 DIAGNOSIS — W19XXXD Unspecified fall, subsequent encounter: Secondary | ICD-10-CM | POA: Diagnosis not present

## 2017-03-04 DIAGNOSIS — I214 Non-ST elevation (NSTEMI) myocardial infarction: Secondary | ICD-10-CM | POA: Diagnosis not present

## 2017-03-04 DIAGNOSIS — K219 Gastro-esophageal reflux disease without esophagitis: Secondary | ICD-10-CM | POA: Diagnosis not present

## 2017-03-04 DIAGNOSIS — I10 Essential (primary) hypertension: Secondary | ICD-10-CM | POA: Diagnosis not present

## 2017-03-04 DIAGNOSIS — G309 Alzheimer's disease, unspecified: Secondary | ICD-10-CM | POA: Diagnosis not present

## 2017-03-06 DIAGNOSIS — I214 Non-ST elevation (NSTEMI) myocardial infarction: Secondary | ICD-10-CM | POA: Diagnosis not present

## 2017-03-06 DIAGNOSIS — I10 Essential (primary) hypertension: Secondary | ICD-10-CM | POA: Diagnosis not present

## 2017-03-06 DIAGNOSIS — G309 Alzheimer's disease, unspecified: Secondary | ICD-10-CM | POA: Diagnosis not present

## 2017-03-06 DIAGNOSIS — K219 Gastro-esophageal reflux disease without esophagitis: Secondary | ICD-10-CM | POA: Diagnosis not present

## 2017-03-06 DIAGNOSIS — W19XXXD Unspecified fall, subsequent encounter: Secondary | ICD-10-CM | POA: Diagnosis not present

## 2017-03-06 DIAGNOSIS — F0280 Dementia in other diseases classified elsewhere without behavioral disturbance: Secondary | ICD-10-CM | POA: Diagnosis not present

## 2017-03-07 ENCOUNTER — Ambulatory Visit (INDEPENDENT_AMBULATORY_CARE_PROVIDER_SITE_OTHER): Payer: Medicare Other | Admitting: Cardiovascular Disease

## 2017-03-07 ENCOUNTER — Encounter: Payer: Self-pay | Admitting: Cardiovascular Disease

## 2017-03-07 VITALS — BP 128/69 | HR 66 | Ht 62.0 in | Wt 129.6 lb

## 2017-03-07 DIAGNOSIS — I10 Essential (primary) hypertension: Secondary | ICD-10-CM | POA: Diagnosis not present

## 2017-03-07 DIAGNOSIS — I25118 Atherosclerotic heart disease of native coronary artery with other forms of angina pectoris: Secondary | ICD-10-CM

## 2017-03-07 DIAGNOSIS — I214 Non-ST elevation (NSTEMI) myocardial infarction: Secondary | ICD-10-CM | POA: Diagnosis not present

## 2017-03-07 DIAGNOSIS — Z9289 Personal history of other medical treatment: Secondary | ICD-10-CM

## 2017-03-07 DIAGNOSIS — E782 Mixed hyperlipidemia: Secondary | ICD-10-CM

## 2017-03-07 NOTE — Progress Notes (Signed)
SUBJECTIVE: The patient is an 83 year old woman who I am meeting for the first time. She was recently hospitalized for a non-STEMI with troponins peaking at 10.9. She was treated with aspirin, Lovenox, beta blocker, statin, and Plavix. There was apparently a discussion held with the family and the patient and they preferred not to undergo coronary angiography. She was treated medically. She is a DO NOT RESUSCITATE CODE STATUS. She has poor exercise tolerance.  I reviewed all relevant studies, labs, and documentation pertaining to this observation.  Echocardiogram 03/02/17: Left ventricle: The cavity size was normal. Wall thickness was increased in a pattern of mild LVH. Systolic function was normal. The estimated ejection fraction was in the range of 60% to 65%. Wall motion was normal; there were no regional wall motion abnormalities. Doppler parameters are consistent with abnormal left ventricular relaxation (grade 1 diastolic dysfunction). - Aortic valve: There was mild regurgitation. - Mitral valve: There was mild regurgitation. - Atrial septum: A patent foramen ovale cannot be excluded. - Tricuspid valve: There was moderate regurgitation. - Pulmonary arteries: PA peak pressure: 39 mm Hg (S).  She lives with her son in an apartment. He does all the cooking and cleaning. She gets physical therapy in her apartment 3 times per week. She denies exertional chest pain. She has very little shortness of breath. She also denies palpitations, lightheadedness, dizziness, syncope, and bleeding problems.  She enjoys walking the hallways in her apartment building. She has never driven.    Review of Systems: As per "subjective", otherwise negative.  No Known Allergies  Current Outpatient Prescriptions  Medication Sig Dispense Refill  . aspirin EC 81 MG EC tablet Take 1 tablet (81 mg total) by mouth daily. 30 tablet 11  . atorvastatin (LIPITOR) 40 MG tablet Take 1 tablet (40 mg  total) by mouth daily at 6 PM. 30 tablet 11  . clopidogrel (PLAVIX) 75 MG tablet Take 1 tablet (75 mg total) by mouth daily. 30 tablet 11  . donepezil (ARICEPT) 5 MG tablet Take 5 mg by mouth daily.    . metoprolol tartrate (LOPRESSOR) 25 MG tablet Take 1 tablet (25 mg total) by mouth 2 (two) times daily. 60 tablet 11  . nitroGLYCERIN (NITROSTAT) 0.4 MG SL tablet Place 1 tablet (0.4 mg total) under the tongue every 5 (five) minutes x 3 doses as needed for chest pain. 25 tablet 12  . pantoprazole (PROTONIX) 20 MG tablet Take 1 tablet (20 mg total) by mouth daily. 30 tablet 11   No current facility-administered medications for this visit.     Past Medical History:  Diagnosis Date  . Hypertension     Past Surgical History:  Procedure Laterality Date  . CHOLECYSTECTOMY    . MASTECTOMY      Social History   Social History  . Marital status: Widowed    Spouse name: N/A  . Number of children: N/A  . Years of education: N/A   Occupational History  . Not on file.   Social History Main Topics  . Smoking status: Former Smoker    Quit date: 03/01/1987  . Smokeless tobacco: Never Used  . Alcohol use No  . Drug use: No  . Sexual activity: No   Other Topics Concern  . Not on file   Social History Narrative  . No narrative on file     Vitals:   03/07/17 1059 03/07/17 1104  BP: 121/71 128/69  Pulse: 70 66  SpO2: 98% 99%  Weight:  129 lb 9.6 oz (58.8 kg)   Height: 5\' 2"  (1.575 m)     PHYSICAL EXAM General: NAD HEENT: Normal. Neck: No JVD, no thyromegaly. Lungs: Clear to auscultation bilaterally with normal respiratory effort. CV: Nondisplaced PMI.  Regular rate and rhythm, normal S1/S2, no S3/S4, no murmur. No pretibial or periankle edema.  No carotid bruit.   Abdomen: Soft, nontender, no distention.  Neurologic: Alert and oriented.  Psych: Normal affect. Skin: Normal. Musculoskeletal: No gross deformities.    ECG: Most recent ECG reviewed.      ASSESSMENT AND  PLAN: 1. CAD with NSTEMI: Symptomatically stable. Left ventricular systolic function is normal. Continue aspirin, Plavix, Lipitor, and metoprolol.  2. Hyperlipidemia: Lipids 02/26/17 showed total cholesterol 157, triglycerides 125, HDL 44, LDL 88. Continue Lipitor.  3. Hypertension: Controlled. No changes.   Dispo: fu 3 months.    Time spent: 40 minutes, of which greater than 50% was spent reviewing symptoms, relevant blood tests and studies, and discussing management plan with the patient.   Prentice Docker, M.D., F.A.C.C.

## 2017-03-07 NOTE — Patient Instructions (Signed)
Medication Instructions:  Continue all current medications.  Labwork: none  Testing/Procedures: none  Follow-Up: 3 months   Any Other Special Instructions Will Be Listed Below (If Applicable).  If you need a refill on your cardiac medications before your next appointment, please call your pharmacy.  

## 2017-03-08 DIAGNOSIS — K219 Gastro-esophageal reflux disease without esophagitis: Secondary | ICD-10-CM | POA: Diagnosis not present

## 2017-03-08 DIAGNOSIS — W19XXXD Unspecified fall, subsequent encounter: Secondary | ICD-10-CM | POA: Diagnosis not present

## 2017-03-08 DIAGNOSIS — G309 Alzheimer's disease, unspecified: Secondary | ICD-10-CM | POA: Diagnosis not present

## 2017-03-08 DIAGNOSIS — I10 Essential (primary) hypertension: Secondary | ICD-10-CM | POA: Diagnosis not present

## 2017-03-08 DIAGNOSIS — F0280 Dementia in other diseases classified elsewhere without behavioral disturbance: Secondary | ICD-10-CM | POA: Diagnosis not present

## 2017-03-08 DIAGNOSIS — I214 Non-ST elevation (NSTEMI) myocardial infarction: Secondary | ICD-10-CM | POA: Diagnosis not present

## 2017-03-10 DIAGNOSIS — W19XXXD Unspecified fall, subsequent encounter: Secondary | ICD-10-CM | POA: Diagnosis not present

## 2017-03-10 DIAGNOSIS — I214 Non-ST elevation (NSTEMI) myocardial infarction: Secondary | ICD-10-CM | POA: Diagnosis not present

## 2017-03-10 DIAGNOSIS — G309 Alzheimer's disease, unspecified: Secondary | ICD-10-CM | POA: Diagnosis not present

## 2017-03-10 DIAGNOSIS — F0280 Dementia in other diseases classified elsewhere without behavioral disturbance: Secondary | ICD-10-CM | POA: Diagnosis not present

## 2017-03-10 DIAGNOSIS — K219 Gastro-esophageal reflux disease without esophagitis: Secondary | ICD-10-CM | POA: Diagnosis not present

## 2017-03-10 DIAGNOSIS — I10 Essential (primary) hypertension: Secondary | ICD-10-CM | POA: Diagnosis not present

## 2017-03-13 DIAGNOSIS — W19XXXD Unspecified fall, subsequent encounter: Secondary | ICD-10-CM | POA: Diagnosis not present

## 2017-03-13 DIAGNOSIS — F0280 Dementia in other diseases classified elsewhere without behavioral disturbance: Secondary | ICD-10-CM | POA: Diagnosis not present

## 2017-03-13 DIAGNOSIS — K219 Gastro-esophageal reflux disease without esophagitis: Secondary | ICD-10-CM | POA: Diagnosis not present

## 2017-03-13 DIAGNOSIS — G309 Alzheimer's disease, unspecified: Secondary | ICD-10-CM | POA: Diagnosis not present

## 2017-03-13 DIAGNOSIS — I10 Essential (primary) hypertension: Secondary | ICD-10-CM | POA: Diagnosis not present

## 2017-03-13 DIAGNOSIS — I214 Non-ST elevation (NSTEMI) myocardial infarction: Secondary | ICD-10-CM | POA: Diagnosis not present

## 2017-03-14 DIAGNOSIS — I10 Essential (primary) hypertension: Secondary | ICD-10-CM | POA: Diagnosis not present

## 2017-03-14 DIAGNOSIS — W19XXXD Unspecified fall, subsequent encounter: Secondary | ICD-10-CM | POA: Diagnosis not present

## 2017-03-14 DIAGNOSIS — I214 Non-ST elevation (NSTEMI) myocardial infarction: Secondary | ICD-10-CM | POA: Diagnosis not present

## 2017-03-14 DIAGNOSIS — F0280 Dementia in other diseases classified elsewhere without behavioral disturbance: Secondary | ICD-10-CM | POA: Diagnosis not present

## 2017-03-14 DIAGNOSIS — K219 Gastro-esophageal reflux disease without esophagitis: Secondary | ICD-10-CM | POA: Diagnosis not present

## 2017-03-14 DIAGNOSIS — G309 Alzheimer's disease, unspecified: Secondary | ICD-10-CM | POA: Diagnosis not present

## 2017-03-16 DIAGNOSIS — F0280 Dementia in other diseases classified elsewhere without behavioral disturbance: Secondary | ICD-10-CM | POA: Diagnosis not present

## 2017-03-16 DIAGNOSIS — I214 Non-ST elevation (NSTEMI) myocardial infarction: Secondary | ICD-10-CM | POA: Diagnosis not present

## 2017-03-16 DIAGNOSIS — I10 Essential (primary) hypertension: Secondary | ICD-10-CM | POA: Diagnosis not present

## 2017-03-16 DIAGNOSIS — K219 Gastro-esophageal reflux disease without esophagitis: Secondary | ICD-10-CM | POA: Diagnosis not present

## 2017-03-16 DIAGNOSIS — W19XXXD Unspecified fall, subsequent encounter: Secondary | ICD-10-CM | POA: Diagnosis not present

## 2017-03-16 DIAGNOSIS — G309 Alzheimer's disease, unspecified: Secondary | ICD-10-CM | POA: Diagnosis not present

## 2017-03-20 DIAGNOSIS — K219 Gastro-esophageal reflux disease without esophagitis: Secondary | ICD-10-CM | POA: Diagnosis not present

## 2017-03-20 DIAGNOSIS — F0280 Dementia in other diseases classified elsewhere without behavioral disturbance: Secondary | ICD-10-CM | POA: Diagnosis not present

## 2017-03-20 DIAGNOSIS — I10 Essential (primary) hypertension: Secondary | ICD-10-CM | POA: Diagnosis not present

## 2017-03-20 DIAGNOSIS — G309 Alzheimer's disease, unspecified: Secondary | ICD-10-CM | POA: Diagnosis not present

## 2017-03-20 DIAGNOSIS — I214 Non-ST elevation (NSTEMI) myocardial infarction: Secondary | ICD-10-CM | POA: Diagnosis not present

## 2017-03-20 DIAGNOSIS — W19XXXD Unspecified fall, subsequent encounter: Secondary | ICD-10-CM | POA: Diagnosis not present

## 2017-03-22 DIAGNOSIS — G309 Alzheimer's disease, unspecified: Secondary | ICD-10-CM | POA: Diagnosis not present

## 2017-03-22 DIAGNOSIS — F0280 Dementia in other diseases classified elsewhere without behavioral disturbance: Secondary | ICD-10-CM | POA: Diagnosis not present

## 2017-03-22 DIAGNOSIS — I214 Non-ST elevation (NSTEMI) myocardial infarction: Secondary | ICD-10-CM | POA: Diagnosis not present

## 2017-03-22 DIAGNOSIS — I10 Essential (primary) hypertension: Secondary | ICD-10-CM | POA: Diagnosis not present

## 2017-03-22 DIAGNOSIS — W19XXXD Unspecified fall, subsequent encounter: Secondary | ICD-10-CM | POA: Diagnosis not present

## 2017-03-22 DIAGNOSIS — K219 Gastro-esophageal reflux disease without esophagitis: Secondary | ICD-10-CM | POA: Diagnosis not present

## 2017-03-27 DIAGNOSIS — I10 Essential (primary) hypertension: Secondary | ICD-10-CM | POA: Diagnosis not present

## 2017-03-27 DIAGNOSIS — G309 Alzheimer's disease, unspecified: Secondary | ICD-10-CM | POA: Diagnosis not present

## 2017-03-27 DIAGNOSIS — I214 Non-ST elevation (NSTEMI) myocardial infarction: Secondary | ICD-10-CM | POA: Diagnosis not present

## 2017-03-27 DIAGNOSIS — F0280 Dementia in other diseases classified elsewhere without behavioral disturbance: Secondary | ICD-10-CM | POA: Diagnosis not present

## 2017-03-27 DIAGNOSIS — W19XXXD Unspecified fall, subsequent encounter: Secondary | ICD-10-CM | POA: Diagnosis not present

## 2017-03-27 DIAGNOSIS — K219 Gastro-esophageal reflux disease without esophagitis: Secondary | ICD-10-CM | POA: Diagnosis not present

## 2017-03-30 DIAGNOSIS — F0280 Dementia in other diseases classified elsewhere without behavioral disturbance: Secondary | ICD-10-CM | POA: Diagnosis not present

## 2017-03-30 DIAGNOSIS — I214 Non-ST elevation (NSTEMI) myocardial infarction: Secondary | ICD-10-CM | POA: Diagnosis not present

## 2017-03-30 DIAGNOSIS — W19XXXD Unspecified fall, subsequent encounter: Secondary | ICD-10-CM | POA: Diagnosis not present

## 2017-03-30 DIAGNOSIS — K219 Gastro-esophageal reflux disease without esophagitis: Secondary | ICD-10-CM | POA: Diagnosis not present

## 2017-03-30 DIAGNOSIS — G309 Alzheimer's disease, unspecified: Secondary | ICD-10-CM | POA: Diagnosis not present

## 2017-03-30 DIAGNOSIS — I10 Essential (primary) hypertension: Secondary | ICD-10-CM | POA: Diagnosis not present

## 2017-04-14 DIAGNOSIS — M545 Low back pain: Secondary | ICD-10-CM | POA: Diagnosis not present

## 2017-04-14 DIAGNOSIS — F0390 Unspecified dementia without behavioral disturbance: Secondary | ICD-10-CM | POA: Diagnosis not present

## 2017-04-14 DIAGNOSIS — J309 Allergic rhinitis, unspecified: Secondary | ICD-10-CM | POA: Diagnosis not present

## 2017-04-14 DIAGNOSIS — I10 Essential (primary) hypertension: Secondary | ICD-10-CM | POA: Diagnosis not present

## 2017-04-14 DIAGNOSIS — Z6824 Body mass index (BMI) 24.0-24.9, adult: Secondary | ICD-10-CM | POA: Diagnosis not present

## 2017-04-14 DIAGNOSIS — Z299 Encounter for prophylactic measures, unspecified: Secondary | ICD-10-CM | POA: Diagnosis not present

## 2017-05-09 DIAGNOSIS — G309 Alzheimer's disease, unspecified: Secondary | ICD-10-CM | POA: Diagnosis not present

## 2017-05-09 DIAGNOSIS — I10 Essential (primary) hypertension: Secondary | ICD-10-CM | POA: Diagnosis not present

## 2017-05-09 DIAGNOSIS — M159 Polyosteoarthritis, unspecified: Secondary | ICD-10-CM | POA: Diagnosis not present

## 2017-06-09 ENCOUNTER — Encounter: Payer: Self-pay | Admitting: Cardiovascular Disease

## 2017-06-09 ENCOUNTER — Ambulatory Visit (INDEPENDENT_AMBULATORY_CARE_PROVIDER_SITE_OTHER): Payer: Medicare Other | Admitting: Cardiovascular Disease

## 2017-06-09 VITALS — BP 148/62 | HR 84 | Ht 62.0 in | Wt 131.0 lb

## 2017-06-09 DIAGNOSIS — R5383 Other fatigue: Secondary | ICD-10-CM | POA: Diagnosis not present

## 2017-06-09 DIAGNOSIS — I252 Old myocardial infarction: Secondary | ICD-10-CM | POA: Diagnosis not present

## 2017-06-09 DIAGNOSIS — I10 Essential (primary) hypertension: Secondary | ICD-10-CM | POA: Diagnosis not present

## 2017-06-09 DIAGNOSIS — E782 Mixed hyperlipidemia: Secondary | ICD-10-CM

## 2017-06-09 DIAGNOSIS — I25118 Atherosclerotic heart disease of native coronary artery with other forms of angina pectoris: Secondary | ICD-10-CM | POA: Diagnosis not present

## 2017-06-09 MED ORDER — METOPROLOL TARTRATE 25 MG PO TABS: 12.5000 mg | ORAL_TABLET | Freq: Two times a day (BID) | ORAL | 6 refills | Status: DC

## 2017-06-09 MED ORDER — ATORVASTATIN CALCIUM 20 MG PO TABS: 20.0000 mg | ORAL_TABLET | Freq: Every day | ORAL | 6 refills | Status: DC

## 2017-06-09 NOTE — Progress Notes (Signed)
SUBJECTIVE: The patient is an 83 year old woman who presents for routine follow-up. She was hospitalized earlier this year for a non-STEMI with troponins peaking at 10.9. She was treated with aspirin, Lovenox, beta blocker, statin, and Plavix. There was apparently a discussion held with the family and the patient and they preferred not to undergo coronary angiography. She was treated medically. She is a DO NOT RESUSCITATE CODE STATUS. She has poor exercise tolerance.  Echocardiogram 03/02/17: Left ventricle: The cavity size was normal. Wall thickness was increased in a pattern of mild LVH. Systolic function was normal. The estimated ejection fraction was in the range of 60% to 65%. Wall motion was normal; there were no regional wall motion abnormalities. Doppler parameters are consistent with abnormal left ventricular relaxation (grade 1 diastolic dysfunction). - Aortic valve: There was mild regurgitation. - Mitral valve: There was mild regurgitation. - Atrial septum: A patent foramen ovale cannot be excluded. - Tricuspid valve: There was moderate regurgitation. - Pulmonary arteries: PA peak pressure: 39 mm Hg (S).  She lives with her son in an apartment. He does all the cooking and cleaning.   She is here with a friend. She denies chest pain, palpitations, and shortness of breath. She has banged her forearms into the bedpost and cut herself. Her friend says the patient has been markedly fatigued.   Review of Systems: As per "subjective", otherwise negative.  No Known Allergies  Current Outpatient Prescriptions  Medication Sig Dispense Refill  . aspirin EC 81 MG EC tablet Take 1 tablet (81 mg total) by mouth daily. 30 tablet 11  . atorvastatin (LIPITOR) 40 MG tablet Take 1 tablet (40 mg total) by mouth daily at 6 PM. 30 tablet 11  . clopidogrel (PLAVIX) 75 MG tablet Take 1 tablet (75 mg total) by mouth daily. 30 tablet 11  . donepezil (ARICEPT) 5 MG tablet Take 5 mg by  mouth daily.    . metoprolol tartrate (LOPRESSOR) 25 MG tablet Take 1 tablet (25 mg total) by mouth 2 (two) times daily. 60 tablet 11  . nitroGLYCERIN (NITROSTAT) 0.4 MG SL tablet Place 1 tablet (0.4 mg total) under the tongue every 5 (five) minutes x 3 doses as needed for chest pain. 25 tablet 12  . pantoprazole (PROTONIX) 20 MG tablet Take 1 tablet (20 mg total) by mouth daily. 30 tablet 11   No current facility-administered medications for this visit.     Past Medical History:  Diagnosis Date  . Hypertension     Past Surgical History:  Procedure Laterality Date  . CHOLECYSTECTOMY    . MASTECTOMY      Social History   Social History  . Marital status: Widowed    Spouse name: N/A  . Number of children: N/A  . Years of education: N/A   Occupational History  . Not on file.   Social History Main Topics  . Smoking status: Former Smoker    Quit date: 03/01/1987  . Smokeless tobacco: Never Used  . Alcohol use No  . Drug use: No  . Sexual activity: No   Other Topics Concern  . Not on file   Social History Narrative  . No narrative on file     Vitals:   06/09/17 1253  BP: (!) 148/62  Pulse: 84  SpO2: 98%  Weight: 131 lb (59.4 kg)  Height: 5\' 2"  (1.575 m)    Wt Readings from Last 3 Encounters:  06/09/17 131 lb (59.4 kg)  03/07/17 129 lb  9.6 oz (58.8 kg)  03/02/17 127 lb 8 oz (57.8 kg)     PHYSICAL EXAM General: NAD HEENT: Normal. Neck: No JVD, no thyromegaly. Lungs: Clear to auscultation bilaterally with normal respiratory effort. CV: Nondisplaced PMI.  Regular rate and rhythm, normal S1/S2, no S3/S4, no murmur. No pretibial or periankle edema.  Abdomen: Soft, nontender, no distention.  Neurologic: Alert.  Psych: Normal affect. Skin: Bilateral forearm bruising with healed lacerations. Musculoskeletal: No gross deformities.    ECG: Most recent ECG reviewed.   Labs: Lab Results  Component Value Date/Time   K 3.6 03/01/2017 05:30 AM   BUN 8  03/01/2017 05:30 AM   CREATININE 0.88 03/01/2017 05:30 AM   HGB 10.6 (L) 03/01/2017 05:30 AM     Lipids: Lab Results  Component Value Date/Time   LDLCALC 88 02/26/2017 11:15 PM   CHOL 157 02/26/2017 11:15 PM   TRIG 125 02/26/2017 11:15 PM   HDL 44 02/26/2017 11:15 PM       ASSESSMENT AND PLAN: 1. CAD with NSTEMI: Symptomatically stable. Left ventricular systolic function is normal. I will reduce the doses of Lipitor and metoprolol and continue aspirin and Plavix.  2. Hyperlipidemia: Lipids 02/26/17 showed total cholesterol 157, triglycerides 125, HDL 44, LDL 88. Continue Lipitor.  3. Hypertension: Mildly elevated today but normally at last visit. No changes.  4. Fatigue: This may be the result of medications. She tells me she is very sensitive to them. I will reduce metoprolol to 12.5 mg twice daily and Lipitor to 20 mg daily given her elderly age and frailty.    Disposition: Follow up 6 months.  Prentice Docker, M.D., F.A.C.C.

## 2017-06-09 NOTE — Patient Instructions (Addendum)
Medication Instructions:   Decrease Lipitor to 20mg  daily.  May take 1/2 tab of the 40mg  tablet till finish current supply.   Decrease Metoprolol to 12.5mg  twice a day.  You will have to break the 25mg  tablet in half.    Continue all other medications.    Labwork: none  Testing/Procedures: none  Follow-Up: Your physician wants you to follow up in: 6 months.  You will receive a reminder letter in the mail one-two months in advance.  If you don't receive a letter, please call our office to schedule the follow up appointment   Any Other Special Instructions Will Be Listed Below (If Applicable).  If you need a refill on your cardiac medications before your next appointment, please call your pharmacy.

## 2017-06-12 DIAGNOSIS — I10 Essential (primary) hypertension: Secondary | ICD-10-CM | POA: Diagnosis not present

## 2017-06-12 DIAGNOSIS — Z6824 Body mass index (BMI) 24.0-24.9, adult: Secondary | ICD-10-CM | POA: Diagnosis not present

## 2017-06-12 DIAGNOSIS — E039 Hypothyroidism, unspecified: Secondary | ICD-10-CM | POA: Diagnosis not present

## 2017-06-12 DIAGNOSIS — Z299 Encounter for prophylactic measures, unspecified: Secondary | ICD-10-CM | POA: Diagnosis not present

## 2017-06-12 DIAGNOSIS — M545 Low back pain: Secondary | ICD-10-CM | POA: Diagnosis not present

## 2017-06-12 DIAGNOSIS — E7800 Pure hypercholesterolemia, unspecified: Secondary | ICD-10-CM | POA: Diagnosis not present

## 2017-07-07 DIAGNOSIS — R440 Auditory hallucinations: Secondary | ICD-10-CM | POA: Diagnosis not present

## 2017-07-07 DIAGNOSIS — N390 Urinary tract infection, site not specified: Secondary | ICD-10-CM | POA: Diagnosis not present

## 2017-07-07 DIAGNOSIS — Z299 Encounter for prophylactic measures, unspecified: Secondary | ICD-10-CM | POA: Diagnosis not present

## 2017-07-07 DIAGNOSIS — I10 Essential (primary) hypertension: Secondary | ICD-10-CM | POA: Diagnosis not present

## 2017-07-07 DIAGNOSIS — Z6825 Body mass index (BMI) 25.0-25.9, adult: Secondary | ICD-10-CM | POA: Diagnosis not present

## 2017-07-10 DIAGNOSIS — I10 Essential (primary) hypertension: Secondary | ICD-10-CM | POA: Diagnosis not present

## 2017-07-10 DIAGNOSIS — G309 Alzheimer's disease, unspecified: Secondary | ICD-10-CM | POA: Diagnosis not present

## 2017-07-10 DIAGNOSIS — M159 Polyosteoarthritis, unspecified: Secondary | ICD-10-CM | POA: Diagnosis not present

## 2017-07-12 DIAGNOSIS — R5383 Other fatigue: Secondary | ICD-10-CM | POA: Diagnosis not present

## 2017-07-12 DIAGNOSIS — F0390 Unspecified dementia without behavioral disturbance: Secondary | ICD-10-CM | POA: Diagnosis not present

## 2017-07-12 DIAGNOSIS — Z7189 Other specified counseling: Secondary | ICD-10-CM | POA: Diagnosis not present

## 2017-07-12 DIAGNOSIS — Z79899 Other long term (current) drug therapy: Secondary | ICD-10-CM | POA: Diagnosis not present

## 2017-07-12 DIAGNOSIS — Z299 Encounter for prophylactic measures, unspecified: Secondary | ICD-10-CM | POA: Diagnosis not present

## 2017-07-12 DIAGNOSIS — Z0000 Encounter for general adult medical examination without abnormal findings: Secondary | ICD-10-CM | POA: Diagnosis not present

## 2017-07-12 DIAGNOSIS — E7800 Pure hypercholesterolemia, unspecified: Secondary | ICD-10-CM | POA: Diagnosis not present

## 2017-07-12 DIAGNOSIS — E039 Hypothyroidism, unspecified: Secondary | ICD-10-CM | POA: Diagnosis not present

## 2017-07-12 DIAGNOSIS — I10 Essential (primary) hypertension: Secondary | ICD-10-CM | POA: Diagnosis not present

## 2017-07-12 DIAGNOSIS — Z1389 Encounter for screening for other disorder: Secondary | ICD-10-CM | POA: Diagnosis not present

## 2017-07-12 DIAGNOSIS — Z6824 Body mass index (BMI) 24.0-24.9, adult: Secondary | ICD-10-CM | POA: Diagnosis not present

## 2017-07-13 DIAGNOSIS — M159 Polyosteoarthritis, unspecified: Secondary | ICD-10-CM | POA: Diagnosis not present

## 2017-07-13 DIAGNOSIS — I10 Essential (primary) hypertension: Secondary | ICD-10-CM | POA: Diagnosis not present

## 2017-07-13 DIAGNOSIS — G309 Alzheimer's disease, unspecified: Secondary | ICD-10-CM | POA: Diagnosis not present

## 2017-07-21 DIAGNOSIS — Z79899 Other long term (current) drug therapy: Secondary | ICD-10-CM | POA: Diagnosis not present

## 2017-07-21 DIAGNOSIS — E039 Hypothyroidism, unspecified: Secondary | ICD-10-CM | POA: Diagnosis not present

## 2017-07-21 DIAGNOSIS — R5383 Other fatigue: Secondary | ICD-10-CM | POA: Diagnosis not present

## 2017-07-21 DIAGNOSIS — E7800 Pure hypercholesterolemia, unspecified: Secondary | ICD-10-CM | POA: Diagnosis not present

## 2017-07-25 DIAGNOSIS — E039 Hypothyroidism, unspecified: Secondary | ICD-10-CM | POA: Diagnosis not present

## 2017-07-25 DIAGNOSIS — E7800 Pure hypercholesterolemia, unspecified: Secondary | ICD-10-CM | POA: Diagnosis not present

## 2017-07-25 DIAGNOSIS — Z6825 Body mass index (BMI) 25.0-25.9, adult: Secondary | ICD-10-CM | POA: Diagnosis not present

## 2017-07-25 DIAGNOSIS — Z299 Encounter for prophylactic measures, unspecified: Secondary | ICD-10-CM | POA: Diagnosis not present

## 2017-07-25 DIAGNOSIS — F0390 Unspecified dementia without behavioral disturbance: Secondary | ICD-10-CM | POA: Diagnosis not present

## 2017-07-25 DIAGNOSIS — Z713 Dietary counseling and surveillance: Secondary | ICD-10-CM | POA: Diagnosis not present

## 2017-07-25 DIAGNOSIS — N390 Urinary tract infection, site not specified: Secondary | ICD-10-CM | POA: Diagnosis not present

## 2017-07-25 DIAGNOSIS — M1710 Unilateral primary osteoarthritis, unspecified knee: Secondary | ICD-10-CM | POA: Diagnosis not present

## 2017-07-25 DIAGNOSIS — I10 Essential (primary) hypertension: Secondary | ICD-10-CM | POA: Diagnosis not present

## 2017-08-17 DIAGNOSIS — M159 Polyosteoarthritis, unspecified: Secondary | ICD-10-CM | POA: Diagnosis not present

## 2017-08-17 DIAGNOSIS — I10 Essential (primary) hypertension: Secondary | ICD-10-CM | POA: Diagnosis not present

## 2017-08-17 DIAGNOSIS — G309 Alzheimer's disease, unspecified: Secondary | ICD-10-CM | POA: Diagnosis not present

## 2017-09-13 DIAGNOSIS — Z6826 Body mass index (BMI) 26.0-26.9, adult: Secondary | ICD-10-CM | POA: Diagnosis not present

## 2017-09-13 DIAGNOSIS — Z299 Encounter for prophylactic measures, unspecified: Secondary | ICD-10-CM | POA: Diagnosis not present

## 2017-09-13 DIAGNOSIS — I10 Essential (primary) hypertension: Secondary | ICD-10-CM | POA: Diagnosis not present

## 2017-09-13 DIAGNOSIS — Z23 Encounter for immunization: Secondary | ICD-10-CM | POA: Diagnosis not present

## 2017-09-13 DIAGNOSIS — R279 Unspecified lack of coordination: Secondary | ICD-10-CM | POA: Diagnosis not present

## 2017-09-13 DIAGNOSIS — E039 Hypothyroidism, unspecified: Secondary | ICD-10-CM | POA: Diagnosis not present

## 2017-09-13 DIAGNOSIS — Z713 Dietary counseling and surveillance: Secondary | ICD-10-CM | POA: Diagnosis not present

## 2017-09-13 DIAGNOSIS — E7800 Pure hypercholesterolemia, unspecified: Secondary | ICD-10-CM | POA: Diagnosis not present

## 2017-09-13 DIAGNOSIS — F0390 Unspecified dementia without behavioral disturbance: Secondary | ICD-10-CM | POA: Diagnosis not present

## 2017-09-13 DIAGNOSIS — R5383 Other fatigue: Secondary | ICD-10-CM | POA: Diagnosis not present

## 2017-09-27 DIAGNOSIS — M159 Polyosteoarthritis, unspecified: Secondary | ICD-10-CM | POA: Diagnosis not present

## 2017-09-27 DIAGNOSIS — G309 Alzheimer's disease, unspecified: Secondary | ICD-10-CM | POA: Diagnosis not present

## 2017-09-27 DIAGNOSIS — I10 Essential (primary) hypertension: Secondary | ICD-10-CM | POA: Diagnosis not present

## 2017-10-16 DIAGNOSIS — M159 Polyosteoarthritis, unspecified: Secondary | ICD-10-CM | POA: Diagnosis not present

## 2017-10-16 DIAGNOSIS — G309 Alzheimer's disease, unspecified: Secondary | ICD-10-CM | POA: Diagnosis not present

## 2017-10-16 DIAGNOSIS — I10 Essential (primary) hypertension: Secondary | ICD-10-CM | POA: Diagnosis not present

## 2017-11-22 DIAGNOSIS — I10 Essential (primary) hypertension: Secondary | ICD-10-CM | POA: Diagnosis not present

## 2017-11-22 DIAGNOSIS — G309 Alzheimer's disease, unspecified: Secondary | ICD-10-CM | POA: Diagnosis not present

## 2017-11-22 DIAGNOSIS — M159 Polyosteoarthritis, unspecified: Secondary | ICD-10-CM | POA: Diagnosis not present

## 2017-12-07 ENCOUNTER — Ambulatory Visit: Payer: Medicare Other | Admitting: Cardiovascular Disease

## 2017-12-20 ENCOUNTER — Other Ambulatory Visit: Payer: Self-pay | Admitting: Cardiovascular Disease

## 2018-01-05 ENCOUNTER — Other Ambulatory Visit: Payer: Self-pay | Admitting: Cardiology

## 2018-01-11 ENCOUNTER — Ambulatory Visit (INDEPENDENT_AMBULATORY_CARE_PROVIDER_SITE_OTHER): Payer: Medicare Other | Admitting: Cardiovascular Disease

## 2018-01-11 ENCOUNTER — Encounter: Payer: Self-pay | Admitting: Cardiovascular Disease

## 2018-01-11 VITALS — BP 100/64 | HR 102 | Ht 62.0 in | Wt 141.0 lb

## 2018-01-11 DIAGNOSIS — E782 Mixed hyperlipidemia: Secondary | ICD-10-CM

## 2018-01-11 DIAGNOSIS — I252 Old myocardial infarction: Secondary | ICD-10-CM | POA: Diagnosis not present

## 2018-01-11 DIAGNOSIS — I25118 Atherosclerotic heart disease of native coronary artery with other forms of angina pectoris: Secondary | ICD-10-CM | POA: Diagnosis not present

## 2018-01-11 DIAGNOSIS — I10 Essential (primary) hypertension: Secondary | ICD-10-CM | POA: Diagnosis not present

## 2018-01-11 NOTE — Patient Instructions (Signed)

## 2018-01-11 NOTE — Progress Notes (Signed)
SUBJECTIVE: The patient presents for routine follow-up.  She is 84 years old.  She was hospitalized in 2018 for a non-STEMI with troponins peaking at 10.9.  She was treated with aspirin, Lovenox, beta-blocker, statin, and Plavix.  There was apparently a discussion held with the family and the patient and they preferred not to undergo coronary angiography. She was treated medically. She is a DO NOT RESUSCITATE CODE STATUS. She has poor exercise tolerance.  Echocardiogram 03/02/17: Left ventricle: The cavity size was normal. Wall thickness was increased in a pattern of mild LVH. Systolic function was normal. The estimated ejection fraction was in the range of 60% to 65%. Wall motion was normal; there were no regional wall motion abnormalities. Doppler parameters are consistent with abnormal left ventricular relaxation (grade 1 diastolic dysfunction). - Aortic valve: There was mild regurgitation. - Mitral valve: There was mild regurgitation. - Atrial septum: A patent foramen ovale cannot be excluded. - Tricuspid valve: There was moderate regurgitation. - Pulmonary arteries: PA peak pressure: 39 mm Hg (S).   She lives with her son in an apartment. He does all the cooking and cleaning.   She is again here with her friend today.  She denies exertional chest pain.  She does have some exertional dyspnea which is stable.  She denies orthopnea, leg swelling, palpitations, and paroxysmal nocturnal dyspnea.  ECG performed today which I personally reviewed demonstrates sinus rhythm with old anterolateral infarct and left axis deviation.    Review of Systems: As per "subjective", otherwise negative.  No Known Allergies  Current Outpatient Medications  Medication Sig Dispense Refill  . aspirin EC 81 MG EC tablet Take 1 tablet (81 mg total) by mouth daily. 30 tablet 11  . atorvastatin (LIPITOR) 20 MG tablet Take 1 tablet (20 mg total) by mouth daily. 30 tablet 6  . clopidogrel  (PLAVIX) 75 MG tablet Take 1 tablet (75 mg total) by mouth daily. 30 tablet 11  . donepezil (ARICEPT) 5 MG tablet Take 5 mg by mouth daily.    Marland Kitchen loratadine (CLARITIN) 10 MG tablet Take 10 mg by mouth daily.    . metoprolol tartrate (LOPRESSOR) 25 MG tablet TAKE (1/2) TABLET BY MOUTH TWICE DAILY. 28 tablet 0  . nitroGLYCERIN (NITROSTAT) 0.4 MG SL tablet Place 1 tablet (0.4 mg total) under the tongue every 5 (five) minutes x 3 doses as needed for chest pain. 25 tablet 12  . risperiDONE (RISPERDAL) 0.5 MG tablet Take 0.5 mg by mouth at bedtime.     No current facility-administered medications for this visit.     Past Medical History:  Diagnosis Date  . Hypertension     Past Surgical History:  Procedure Laterality Date  . CHOLECYSTECTOMY    . MASTECTOMY      Social History   Socioeconomic History  . Marital status: Widowed    Spouse name: Not on file  . Number of children: Not on file  . Years of education: Not on file  . Highest education level: Not on file  Social Needs  . Financial resource strain: Not on file  . Food insecurity - worry: Not on file  . Food insecurity - inability: Not on file  . Transportation needs - medical: Not on file  . Transportation needs - non-medical: Not on file  Occupational History  . Not on file  Tobacco Use  . Smoking status: Former Smoker    Last attempt to quit: 03/01/1987    Years since quitting:  30.8  . Smokeless tobacco: Never Used  Substance and Sexual Activity  . Alcohol use: No  . Drug use: No  . Sexual activity: No  Other Topics Concern  . Not on file  Social History Narrative  . Not on file     Vitals:   01/11/18 1052  BP: 100/64  Pulse: (!) 102  SpO2: 98%  Weight: 141 lb (64 kg)  Height: 5\' 2"  (1.575 m)    Wt Readings from Last 3 Encounters:  01/11/18 141 lb (64 kg)  06/09/17 131 lb (59.4 kg)  03/07/17 129 lb 9.6 oz (58.8 kg)     PHYSICAL EXAM General: NAD, elderly, chronically ill-appearing HEENT:  Normal. Neck: No JVD, no thyromegaly. Lungs: Clear to auscultation bilaterally with normal respiratory effort. CV: Regular rate and rhythm, normal S1/S2, no S3/S4, no murmur. No pretibial or periankle edema.  Abdomen: Soft, nontender, no distention.  Neurologic: Alert and oriented.  Psych: Normal affect. Skin: Normal. Musculoskeletal: No gross deformities.    ECG: Most recent ECG reviewed.   Labs: Lab Results  Component Value Date/Time   K 3.6 03/01/2017 05:30 AM   BUN 8 03/01/2017 05:30 AM   CREATININE 0.88 03/01/2017 05:30 AM   HGB 10.6 (L) 03/01/2017 05:30 AM     Lipids: Lab Results  Component Value Date/Time   LDLCALC 88 02/26/2017 11:15 PM   CHOL 157 02/26/2017 11:15 PM   TRIG 125 02/26/2017 11:15 PM   HDL 44 02/26/2017 11:15 PM       ASSESSMENT AND PLAN: 1.  Coronary disease with history of non-STEMI: Symptomatically stable.  She does have some exertional dyspnea but I do not want to increase the dose of metoprolol given her low normal blood pressure.  Continue aspirin, Plavix, metoprolol, and Lipitor at present doses.  2.  Hyperlipidemia: Lipids from March 2018 reviewed with LDL 88.  Continue Lipitor.  3.  Hypertension: Controlled.  No change to therapy.    Disposition: Follow up 6 months   Prentice DockerSuresh Jerame Hedding, M.D., F.A.C.C.

## 2018-01-19 DIAGNOSIS — I10 Essential (primary) hypertension: Secondary | ICD-10-CM | POA: Diagnosis not present

## 2018-01-19 DIAGNOSIS — M159 Polyosteoarthritis, unspecified: Secondary | ICD-10-CM | POA: Diagnosis not present

## 2018-01-19 DIAGNOSIS — G309 Alzheimer's disease, unspecified: Secondary | ICD-10-CM | POA: Diagnosis not present

## 2018-01-25 ENCOUNTER — Other Ambulatory Visit: Payer: Self-pay | Admitting: Cardiovascular Disease

## 2018-02-12 DIAGNOSIS — E7800 Pure hypercholesterolemia, unspecified: Secondary | ICD-10-CM | POA: Diagnosis not present

## 2018-02-12 DIAGNOSIS — Z299 Encounter for prophylactic measures, unspecified: Secondary | ICD-10-CM | POA: Diagnosis not present

## 2018-02-12 DIAGNOSIS — Z6826 Body mass index (BMI) 26.0-26.9, adult: Secondary | ICD-10-CM | POA: Diagnosis not present

## 2018-02-12 DIAGNOSIS — I10 Essential (primary) hypertension: Secondary | ICD-10-CM | POA: Diagnosis not present

## 2018-02-12 DIAGNOSIS — D649 Anemia, unspecified: Secondary | ICD-10-CM | POA: Diagnosis not present

## 2018-02-12 DIAGNOSIS — E039 Hypothyroidism, unspecified: Secondary | ICD-10-CM | POA: Diagnosis not present

## 2018-02-12 DIAGNOSIS — Z79899 Other long term (current) drug therapy: Secondary | ICD-10-CM | POA: Diagnosis not present

## 2018-03-01 DIAGNOSIS — I10 Essential (primary) hypertension: Secondary | ICD-10-CM | POA: Diagnosis not present

## 2018-03-01 DIAGNOSIS — G309 Alzheimer's disease, unspecified: Secondary | ICD-10-CM | POA: Diagnosis not present

## 2018-03-01 DIAGNOSIS — M159 Polyosteoarthritis, unspecified: Secondary | ICD-10-CM | POA: Diagnosis not present

## 2018-03-05 DIAGNOSIS — Z789 Other specified health status: Secondary | ICD-10-CM | POA: Diagnosis not present

## 2018-03-05 DIAGNOSIS — Z299 Encounter for prophylactic measures, unspecified: Secondary | ICD-10-CM | POA: Diagnosis not present

## 2018-03-05 DIAGNOSIS — R5383 Other fatigue: Secondary | ICD-10-CM | POA: Diagnosis not present

## 2018-03-05 DIAGNOSIS — I10 Essential (primary) hypertension: Secondary | ICD-10-CM | POA: Diagnosis not present

## 2018-03-05 DIAGNOSIS — Z6827 Body mass index (BMI) 27.0-27.9, adult: Secondary | ICD-10-CM | POA: Diagnosis not present

## 2018-03-07 DIAGNOSIS — R5383 Other fatigue: Secondary | ICD-10-CM | POA: Diagnosis not present

## 2018-03-07 DIAGNOSIS — I10 Essential (primary) hypertension: Secondary | ICD-10-CM | POA: Diagnosis not present

## 2018-03-15 DIAGNOSIS — G309 Alzheimer's disease, unspecified: Secondary | ICD-10-CM | POA: Diagnosis not present

## 2018-03-15 DIAGNOSIS — I10 Essential (primary) hypertension: Secondary | ICD-10-CM | POA: Diagnosis not present

## 2018-03-15 DIAGNOSIS — M159 Polyosteoarthritis, unspecified: Secondary | ICD-10-CM | POA: Diagnosis not present

## 2018-03-16 ENCOUNTER — Other Ambulatory Visit: Payer: Self-pay | Admitting: Physician Assistant

## 2018-04-16 DIAGNOSIS — I10 Essential (primary) hypertension: Secondary | ICD-10-CM | POA: Diagnosis not present

## 2018-04-16 DIAGNOSIS — G309 Alzheimer's disease, unspecified: Secondary | ICD-10-CM | POA: Diagnosis not present

## 2018-04-16 DIAGNOSIS — M159 Polyosteoarthritis, unspecified: Secondary | ICD-10-CM | POA: Diagnosis not present

## 2018-04-20 DIAGNOSIS — Z6827 Body mass index (BMI) 27.0-27.9, adult: Secondary | ICD-10-CM | POA: Diagnosis not present

## 2018-04-20 DIAGNOSIS — I10 Essential (primary) hypertension: Secondary | ICD-10-CM | POA: Diagnosis not present

## 2018-04-20 DIAGNOSIS — Z299 Encounter for prophylactic measures, unspecified: Secondary | ICD-10-CM | POA: Diagnosis not present

## 2018-04-20 DIAGNOSIS — Z713 Dietary counseling and surveillance: Secondary | ICD-10-CM | POA: Diagnosis not present

## 2018-05-29 DIAGNOSIS — E7800 Pure hypercholesterolemia, unspecified: Secondary | ICD-10-CM | POA: Diagnosis not present

## 2018-05-29 DIAGNOSIS — Z299 Encounter for prophylactic measures, unspecified: Secondary | ICD-10-CM | POA: Diagnosis not present

## 2018-05-29 DIAGNOSIS — E039 Hypothyroidism, unspecified: Secondary | ICD-10-CM | POA: Diagnosis not present

## 2018-05-29 DIAGNOSIS — M7989 Other specified soft tissue disorders: Secondary | ICD-10-CM | POA: Diagnosis not present

## 2018-05-29 DIAGNOSIS — M810 Age-related osteoporosis without current pathological fracture: Secondary | ICD-10-CM | POA: Diagnosis not present

## 2018-05-29 DIAGNOSIS — I10 Essential (primary) hypertension: Secondary | ICD-10-CM | POA: Diagnosis not present

## 2018-05-29 DIAGNOSIS — Z6828 Body mass index (BMI) 28.0-28.9, adult: Secondary | ICD-10-CM | POA: Diagnosis not present

## 2018-06-04 DIAGNOSIS — I10 Essential (primary) hypertension: Secondary | ICD-10-CM | POA: Diagnosis not present

## 2018-06-04 DIAGNOSIS — G309 Alzheimer's disease, unspecified: Secondary | ICD-10-CM | POA: Diagnosis not present

## 2018-06-04 DIAGNOSIS — M159 Polyosteoarthritis, unspecified: Secondary | ICD-10-CM | POA: Diagnosis not present

## 2018-06-12 DIAGNOSIS — L84 Corns and callosities: Secondary | ICD-10-CM | POA: Diagnosis not present

## 2018-06-12 DIAGNOSIS — Z6828 Body mass index (BMI) 28.0-28.9, adult: Secondary | ICD-10-CM | POA: Diagnosis not present

## 2018-06-12 DIAGNOSIS — R600 Localized edema: Secondary | ICD-10-CM | POA: Diagnosis not present

## 2018-06-12 DIAGNOSIS — I739 Peripheral vascular disease, unspecified: Secondary | ICD-10-CM | POA: Diagnosis not present

## 2018-06-12 DIAGNOSIS — I10 Essential (primary) hypertension: Secondary | ICD-10-CM | POA: Diagnosis not present

## 2018-06-12 DIAGNOSIS — Z299 Encounter for prophylactic measures, unspecified: Secondary | ICD-10-CM | POA: Diagnosis not present

## 2018-07-02 DIAGNOSIS — I70211 Atherosclerosis of native arteries of extremities with intermittent claudication, right leg: Secondary | ICD-10-CM | POA: Diagnosis not present

## 2018-07-04 DIAGNOSIS — M79671 Pain in right foot: Secondary | ICD-10-CM | POA: Diagnosis not present

## 2018-07-04 DIAGNOSIS — L03032 Cellulitis of left toe: Secondary | ICD-10-CM | POA: Diagnosis not present

## 2018-07-04 DIAGNOSIS — L110 Acquired keratosis follicularis: Secondary | ICD-10-CM | POA: Diagnosis not present

## 2018-07-04 DIAGNOSIS — I739 Peripheral vascular disease, unspecified: Secondary | ICD-10-CM | POA: Diagnosis not present

## 2018-07-09 DIAGNOSIS — G309 Alzheimer's disease, unspecified: Secondary | ICD-10-CM | POA: Diagnosis not present

## 2018-07-09 DIAGNOSIS — M159 Polyosteoarthritis, unspecified: Secondary | ICD-10-CM | POA: Diagnosis not present

## 2018-07-09 DIAGNOSIS — I10 Essential (primary) hypertension: Secondary | ICD-10-CM | POA: Diagnosis not present

## 2018-07-12 DIAGNOSIS — Z299 Encounter for prophylactic measures, unspecified: Secondary | ICD-10-CM | POA: Diagnosis not present

## 2018-07-12 DIAGNOSIS — Z6828 Body mass index (BMI) 28.0-28.9, adult: Secondary | ICD-10-CM | POA: Diagnosis not present

## 2018-07-12 DIAGNOSIS — F0390 Unspecified dementia without behavioral disturbance: Secondary | ICD-10-CM | POA: Diagnosis not present

## 2018-07-12 DIAGNOSIS — I739 Peripheral vascular disease, unspecified: Secondary | ICD-10-CM | POA: Diagnosis not present

## 2018-07-12 DIAGNOSIS — I10 Essential (primary) hypertension: Secondary | ICD-10-CM | POA: Diagnosis not present

## 2018-07-12 DIAGNOSIS — J309 Allergic rhinitis, unspecified: Secondary | ICD-10-CM | POA: Diagnosis not present

## 2018-07-17 ENCOUNTER — Other Ambulatory Visit: Payer: Self-pay

## 2018-07-17 ENCOUNTER — Encounter: Payer: Self-pay | Admitting: Cardiovascular Disease

## 2018-07-17 ENCOUNTER — Ambulatory Visit (INDEPENDENT_AMBULATORY_CARE_PROVIDER_SITE_OTHER): Payer: Medicare Other | Admitting: Cardiovascular Disease

## 2018-07-17 VITALS — BP 124/70 | HR 86 | Ht 62.0 in | Wt 147.0 lb

## 2018-07-17 DIAGNOSIS — I252 Old myocardial infarction: Secondary | ICD-10-CM

## 2018-07-17 DIAGNOSIS — I25118 Atherosclerotic heart disease of native coronary artery with other forms of angina pectoris: Secondary | ICD-10-CM | POA: Diagnosis not present

## 2018-07-17 DIAGNOSIS — E785 Hyperlipidemia, unspecified: Secondary | ICD-10-CM | POA: Diagnosis not present

## 2018-07-17 DIAGNOSIS — I10 Essential (primary) hypertension: Secondary | ICD-10-CM

## 2018-07-17 NOTE — Patient Instructions (Signed)

## 2018-07-17 NOTE — Progress Notes (Signed)
SUBJECTIVE: The patient presents for routine follow-up.  She was hospitalized in 2018 for a non-STEMI with troponins peaking at 10.9. She was treated with aspirin, Lovenox, beta blocker, statin, and Plavix. There was apparently a discussion held with the family and the patient and they preferred not to undergo coronary angiography. She was treated medically. She is a DO NOT RESUSCITATE CODE STATUS. She has poor exercise tolerance.  Echocardiogram 03/02/17: Left ventricle: The cavity size was normal. Wall thickness was increased in a pattern of mild LVH. Systolic function was normal. The estimated ejection fraction was in the range of 60% to 65%. Wall motion was normal; there were no regional wall motion abnormalities. Doppler parameters are consistent with abnormal left ventricular relaxation (grade 1 diastolic dysfunction). - Aortic valve: There was mild regurgitation. - Mitral valve: There was mild regurgitation. - Atrial septum: A patent foramen ovale cannot be excluded. - Tricuspid valve: There was moderate regurgitation. - Pulmonary arteries: PA peak pressure: 39 mm Hg (S).  She lives with her son in an apartment. He does all the cooking and cleaning.   She is here with her niece who is 82 years old.  The patient denies chest pain.  She has some mild exertional dyspnea.  She denies palpitations, orthopnea, and leg swelling.  She apparently had ABIs done at her PCPs office which showed some mild blockages.  I will have to request a copy for personal review.      Review of Systems: As per "subjective", otherwise negative.  No Known Allergies  Current Outpatient Medications  Medication Sig Dispense Refill  . aspirin EC 81 MG EC tablet Take 1 tablet (81 mg total) by mouth daily. 30 tablet 11  . atorvastatin (LIPITOR) 20 MG tablet Take 1 tablet (20 mg total) by mouth daily. 30 tablet 6  . clopidogrel (PLAVIX) 75 MG tablet TAKE (1) TABLET BY MOUTH ONCE DAILY. 90  tablet 1  . donepezil (ARICEPT) 5 MG tablet Take 5 mg by mouth daily.    Marland Kitchen. loratadine (CLARITIN) 10 MG tablet Take 10 mg by mouth daily.    . metoprolol tartrate (LOPRESSOR) 25 MG tablet TAKE (1/2) TABLET BY MOUTH TWICE DAILY. 28 tablet 11  . nitroGLYCERIN (NITROSTAT) 0.4 MG SL tablet Place 1 tablet (0.4 mg total) under the tongue every 5 (five) minutes x 3 doses as needed for chest pain. 25 tablet 12  . pantoprazole (PROTONIX) 20 MG tablet TAKE (1) TABLET BY MOUTH ONCE DAILY. 90 tablet 1   No current facility-administered medications for this visit.     Past Medical History:  Diagnosis Date  . Hypertension     Past Surgical History:  Procedure Laterality Date  . CHOLECYSTECTOMY    . MASTECTOMY      Social History   Socioeconomic History  . Marital status: Widowed    Spouse name: Not on file  . Number of children: Not on file  . Years of education: Not on file  . Highest education level: Not on file  Occupational History  . Not on file  Social Needs  . Financial resource strain: Not on file  . Food insecurity:    Worry: Not on file    Inability: Not on file  . Transportation needs:    Medical: Not on file    Non-medical: Not on file  Tobacco Use  . Smoking status: Former Smoker    Last attempt to quit: 03/01/1987    Years since quitting: 31.4  .  Smokeless tobacco: Never Used  Substance and Sexual Activity  . Alcohol use: No  . Drug use: No  . Sexual activity: Never  Lifestyle  . Physical activity:    Days per week: Not on file    Minutes per session: Not on file  . Stress: Not on file  Relationships  . Social connections:    Talks on phone: Not on file    Gets together: Not on file    Attends religious service: Not on file    Active member of club or organization: Not on file    Attends meetings of clubs or organizations: Not on file    Relationship status: Not on file  . Intimate partner violence:    Fear of current or ex partner: Not on file     Emotionally abused: Not on file    Physically abused: Not on file    Forced sexual activity: Not on file  Other Topics Concern  . Not on file  Social History Narrative  . Not on file     Vitals:   07/17/18 1502  BP: 124/70  Pulse: 86  SpO2: 95%  Weight: 147 lb (66.7 kg)  Height: 5\' 2"  (1.575 m)    Wt Readings from Last 3 Encounters:  07/17/18 147 lb (66.7 kg)  01/11/18 141 lb (64 kg)  06/09/17 131 lb (59.4 kg)     PHYSICAL EXAM General: NAD HEENT: Normal. Neck: No JVD, no thyromegaly. Lungs: Clear to auscultation bilaterally with normal respiratory effort. CV: Regular rate and rhythm, normal S1/S2, no S3/S4, no murmur. No pretibial or periankle edema.     Abdomen: Soft, nontender, no distention.  Neurologic: Alert and oriented.  Psych: Normal affect. Skin: Normal. Musculoskeletal: No gross deformities.    ECG: Reviewed above under Subjective   Labs: Lab Results  Component Value Date/Time   K 3.6 03/01/2017 05:30 AM   BUN 8 03/01/2017 05:30 AM   CREATININE 0.88 03/01/2017 05:30 AM   HGB 10.6 (L) 03/01/2017 05:30 AM     Lipids: Lab Results  Component Value Date/Time   LDLCALC 88 02/26/2017 11:15 PM   CHOL 157 02/26/2017 11:15 PM   TRIG 125 02/26/2017 11:15 PM   HDL 44 02/26/2017 11:15 PM       ASSESSMENT AND PLAN: 1. CAD with NSTEMI: Symptomatically stable. Left ventricular systolic function is normal.  Continue aspirin, Plavix, low-dose metoprolol, and low-dose Lipitor.  2. Hyperlipidemia: Lipids 02/26/17 showed total cholesterol 157, triglycerides 125, HDL 44, LDL 88. Continue Lipitor.  3. Hypertension:  Blood pressure is normal.  No changes to therapy.    Disposition: Follow up 6 months   Prentice Docker, M.D., F.A.C.C.

## 2018-07-20 DIAGNOSIS — M79671 Pain in right foot: Secondary | ICD-10-CM | POA: Diagnosis not present

## 2018-07-20 DIAGNOSIS — I739 Peripheral vascular disease, unspecified: Secondary | ICD-10-CM | POA: Diagnosis not present

## 2018-07-20 DIAGNOSIS — L110 Acquired keratosis follicularis: Secondary | ICD-10-CM | POA: Diagnosis not present

## 2018-07-20 DIAGNOSIS — L03032 Cellulitis of left toe: Secondary | ICD-10-CM | POA: Diagnosis not present

## 2018-07-25 ENCOUNTER — Encounter: Payer: Self-pay | Admitting: *Deleted

## 2018-07-31 DIAGNOSIS — Z299 Encounter for prophylactic measures, unspecified: Secondary | ICD-10-CM | POA: Diagnosis not present

## 2018-07-31 DIAGNOSIS — Z7189 Other specified counseling: Secondary | ICD-10-CM | POA: Diagnosis not present

## 2018-07-31 DIAGNOSIS — E7800 Pure hypercholesterolemia, unspecified: Secondary | ICD-10-CM | POA: Diagnosis not present

## 2018-07-31 DIAGNOSIS — Z1339 Encounter for screening examination for other mental health and behavioral disorders: Secondary | ICD-10-CM | POA: Diagnosis not present

## 2018-07-31 DIAGNOSIS — E039 Hypothyroidism, unspecified: Secondary | ICD-10-CM | POA: Diagnosis not present

## 2018-07-31 DIAGNOSIS — Z0000 Encounter for general adult medical examination without abnormal findings: Secondary | ICD-10-CM | POA: Diagnosis not present

## 2018-07-31 DIAGNOSIS — R5383 Other fatigue: Secondary | ICD-10-CM | POA: Diagnosis not present

## 2018-07-31 DIAGNOSIS — Z6827 Body mass index (BMI) 27.0-27.9, adult: Secondary | ICD-10-CM | POA: Diagnosis not present

## 2018-07-31 DIAGNOSIS — I10 Essential (primary) hypertension: Secondary | ICD-10-CM | POA: Diagnosis not present

## 2018-07-31 DIAGNOSIS — Z1331 Encounter for screening for depression: Secondary | ICD-10-CM | POA: Diagnosis not present

## 2018-08-01 DIAGNOSIS — Z79899 Other long term (current) drug therapy: Secondary | ICD-10-CM | POA: Diagnosis not present

## 2018-08-01 DIAGNOSIS — E7800 Pure hypercholesterolemia, unspecified: Secondary | ICD-10-CM | POA: Diagnosis not present

## 2018-08-01 DIAGNOSIS — R5383 Other fatigue: Secondary | ICD-10-CM | POA: Diagnosis not present

## 2018-08-01 DIAGNOSIS — E039 Hypothyroidism, unspecified: Secondary | ICD-10-CM | POA: Diagnosis not present

## 2018-08-07 DIAGNOSIS — E2839 Other primary ovarian failure: Secondary | ICD-10-CM | POA: Diagnosis not present

## 2018-08-10 DIAGNOSIS — M159 Polyosteoarthritis, unspecified: Secondary | ICD-10-CM | POA: Diagnosis not present

## 2018-08-10 DIAGNOSIS — G309 Alzheimer's disease, unspecified: Secondary | ICD-10-CM | POA: Diagnosis not present

## 2018-08-10 DIAGNOSIS — I10 Essential (primary) hypertension: Secondary | ICD-10-CM | POA: Diagnosis not present

## 2018-08-17 ENCOUNTER — Other Ambulatory Visit: Payer: Self-pay | Admitting: Cardiovascular Disease

## 2018-08-24 DIAGNOSIS — Z6827 Body mass index (BMI) 27.0-27.9, adult: Secondary | ICD-10-CM | POA: Diagnosis not present

## 2018-08-24 DIAGNOSIS — F0390 Unspecified dementia without behavioral disturbance: Secondary | ICD-10-CM | POA: Diagnosis not present

## 2018-08-24 DIAGNOSIS — Z299 Encounter for prophylactic measures, unspecified: Secondary | ICD-10-CM | POA: Diagnosis not present

## 2018-08-24 DIAGNOSIS — I10 Essential (primary) hypertension: Secondary | ICD-10-CM | POA: Diagnosis not present

## 2018-08-24 DIAGNOSIS — N390 Urinary tract infection, site not specified: Secondary | ICD-10-CM | POA: Diagnosis not present

## 2018-08-24 DIAGNOSIS — R410 Disorientation, unspecified: Secondary | ICD-10-CM | POA: Diagnosis not present

## 2018-09-07 DIAGNOSIS — M159 Polyosteoarthritis, unspecified: Secondary | ICD-10-CM | POA: Diagnosis not present

## 2018-09-07 DIAGNOSIS — I10 Essential (primary) hypertension: Secondary | ICD-10-CM | POA: Diagnosis not present

## 2018-09-07 DIAGNOSIS — G309 Alzheimer's disease, unspecified: Secondary | ICD-10-CM | POA: Diagnosis not present

## 2018-10-08 DIAGNOSIS — N390 Urinary tract infection, site not specified: Secondary | ICD-10-CM | POA: Diagnosis not present

## 2018-10-08 DIAGNOSIS — R456 Violent behavior: Secondary | ICD-10-CM | POA: Diagnosis not present

## 2018-10-08 DIAGNOSIS — N3001 Acute cystitis with hematuria: Secondary | ICD-10-CM | POA: Diagnosis not present

## 2018-10-08 DIAGNOSIS — R410 Disorientation, unspecified: Secondary | ICD-10-CM | POA: Diagnosis not present

## 2018-10-08 DIAGNOSIS — Z66 Do not resuscitate: Secondary | ICD-10-CM | POA: Diagnosis not present

## 2018-10-08 DIAGNOSIS — F0391 Unspecified dementia with behavioral disturbance: Secondary | ICD-10-CM | POA: Diagnosis not present

## 2018-10-08 DIAGNOSIS — E876 Hypokalemia: Secondary | ICD-10-CM | POA: Diagnosis not present

## 2018-10-08 DIAGNOSIS — I10 Essential (primary) hypertension: Secondary | ICD-10-CM | POA: Diagnosis not present

## 2018-10-08 DIAGNOSIS — R4182 Altered mental status, unspecified: Secondary | ICD-10-CM | POA: Diagnosis not present

## 2018-10-08 DIAGNOSIS — K219 Gastro-esophageal reflux disease without esophagitis: Secondary | ICD-10-CM | POA: Diagnosis not present

## 2018-10-09 DIAGNOSIS — R4182 Altered mental status, unspecified: Secondary | ICD-10-CM | POA: Diagnosis not present

## 2018-10-09 DIAGNOSIS — Z23 Encounter for immunization: Secondary | ICD-10-CM | POA: Diagnosis not present

## 2018-10-09 DIAGNOSIS — F0391 Unspecified dementia with behavioral disturbance: Secondary | ICD-10-CM | POA: Diagnosis present

## 2018-10-09 DIAGNOSIS — E876 Hypokalemia: Secondary | ICD-10-CM | POA: Diagnosis not present

## 2018-10-09 DIAGNOSIS — N390 Urinary tract infection, site not specified: Secondary | ICD-10-CM | POA: Diagnosis present

## 2018-10-09 DIAGNOSIS — Z87440 Personal history of urinary (tract) infections: Secondary | ICD-10-CM | POA: Diagnosis not present

## 2018-10-09 DIAGNOSIS — I252 Old myocardial infarction: Secondary | ICD-10-CM | POA: Diagnosis not present

## 2018-10-09 DIAGNOSIS — Z87891 Personal history of nicotine dependence: Secondary | ICD-10-CM | POA: Diagnosis not present

## 2018-10-09 DIAGNOSIS — Z853 Personal history of malignant neoplasm of breast: Secondary | ICD-10-CM | POA: Diagnosis not present

## 2018-10-09 DIAGNOSIS — Z791 Long term (current) use of non-steroidal anti-inflammatories (NSAID): Secondary | ICD-10-CM | POA: Diagnosis not present

## 2018-10-09 DIAGNOSIS — Z79899 Other long term (current) drug therapy: Secondary | ICD-10-CM | POA: Diagnosis not present

## 2018-10-09 DIAGNOSIS — K219 Gastro-esophageal reflux disease without esophagitis: Secondary | ICD-10-CM | POA: Diagnosis present

## 2018-10-09 DIAGNOSIS — Z66 Do not resuscitate: Secondary | ICD-10-CM | POA: Diagnosis present

## 2018-10-09 DIAGNOSIS — I10 Essential (primary) hypertension: Secondary | ICD-10-CM | POA: Diagnosis present

## 2018-10-15 DIAGNOSIS — F0390 Unspecified dementia without behavioral disturbance: Secondary | ICD-10-CM | POA: Diagnosis not present

## 2018-10-15 DIAGNOSIS — F0280 Dementia in other diseases classified elsewhere without behavioral disturbance: Secondary | ICD-10-CM | POA: Diagnosis not present

## 2018-10-15 DIAGNOSIS — R4182 Altered mental status, unspecified: Secondary | ICD-10-CM | POA: Diagnosis not present

## 2018-10-15 DIAGNOSIS — F29 Unspecified psychosis not due to a substance or known physiological condition: Secondary | ICD-10-CM | POA: Diagnosis not present

## 2018-10-15 DIAGNOSIS — N390 Urinary tract infection, site not specified: Secondary | ICD-10-CM | POA: Diagnosis not present

## 2018-10-15 DIAGNOSIS — F0391 Unspecified dementia with behavioral disturbance: Secondary | ICD-10-CM | POA: Diagnosis not present

## 2018-10-15 DIAGNOSIS — G309 Alzheimer's disease, unspecified: Secondary | ICD-10-CM | POA: Diagnosis not present

## 2018-10-15 DIAGNOSIS — E876 Hypokalemia: Secondary | ICD-10-CM | POA: Diagnosis not present

## 2018-10-15 DIAGNOSIS — Z23 Encounter for immunization: Secondary | ICD-10-CM | POA: Diagnosis not present

## 2018-10-16 DIAGNOSIS — N390 Urinary tract infection, site not specified: Secondary | ICD-10-CM | POA: Diagnosis not present

## 2018-10-16 DIAGNOSIS — R4182 Altered mental status, unspecified: Secondary | ICD-10-CM | POA: Diagnosis not present

## 2018-10-16 DIAGNOSIS — F0390 Unspecified dementia without behavioral disturbance: Secondary | ICD-10-CM | POA: Diagnosis not present

## 2018-10-25 DIAGNOSIS — F0280 Dementia in other diseases classified elsewhere without behavioral disturbance: Secondary | ICD-10-CM | POA: Diagnosis not present

## 2018-10-25 DIAGNOSIS — G309 Alzheimer's disease, unspecified: Secondary | ICD-10-CM | POA: Diagnosis not present

## 2018-11-10 DIAGNOSIS — F29 Unspecified psychosis not due to a substance or known physiological condition: Secondary | ICD-10-CM | POA: Diagnosis not present

## 2018-11-10 DIAGNOSIS — N390 Urinary tract infection, site not specified: Secondary | ICD-10-CM | POA: Diagnosis not present

## 2018-11-10 DIAGNOSIS — F0390 Unspecified dementia without behavioral disturbance: Secondary | ICD-10-CM | POA: Diagnosis not present

## 2018-11-11 DIAGNOSIS — F0391 Unspecified dementia with behavioral disturbance: Secondary | ICD-10-CM | POA: Diagnosis not present

## 2018-11-11 DIAGNOSIS — M6281 Muscle weakness (generalized): Secondary | ICD-10-CM | POA: Diagnosis not present

## 2018-11-11 DIAGNOSIS — I10 Essential (primary) hypertension: Secondary | ICD-10-CM | POA: Diagnosis not present

## 2018-11-11 DIAGNOSIS — R41841 Cognitive communication deficit: Secondary | ICD-10-CM | POA: Diagnosis not present

## 2018-11-11 DIAGNOSIS — R2689 Other abnormalities of gait and mobility: Secondary | ICD-10-CM | POA: Diagnosis not present

## 2018-11-11 DIAGNOSIS — Z9011 Acquired absence of right breast and nipple: Secondary | ICD-10-CM | POA: Diagnosis not present

## 2018-11-14 DIAGNOSIS — F0391 Unspecified dementia with behavioral disturbance: Secondary | ICD-10-CM | POA: Diagnosis not present

## 2018-11-14 DIAGNOSIS — M6281 Muscle weakness (generalized): Secondary | ICD-10-CM | POA: Diagnosis not present

## 2018-11-14 DIAGNOSIS — Z9011 Acquired absence of right breast and nipple: Secondary | ICD-10-CM | POA: Diagnosis not present

## 2018-11-14 DIAGNOSIS — R2689 Other abnormalities of gait and mobility: Secondary | ICD-10-CM | POA: Diagnosis not present

## 2018-11-14 DIAGNOSIS — I10 Essential (primary) hypertension: Secondary | ICD-10-CM | POA: Diagnosis not present

## 2018-11-14 DIAGNOSIS — R41841 Cognitive communication deficit: Secondary | ICD-10-CM | POA: Diagnosis not present

## 2018-11-16 DIAGNOSIS — R2689 Other abnormalities of gait and mobility: Secondary | ICD-10-CM | POA: Diagnosis not present

## 2018-11-16 DIAGNOSIS — I10 Essential (primary) hypertension: Secondary | ICD-10-CM | POA: Diagnosis not present

## 2018-11-16 DIAGNOSIS — R41841 Cognitive communication deficit: Secondary | ICD-10-CM | POA: Diagnosis not present

## 2018-11-16 DIAGNOSIS — M6281 Muscle weakness (generalized): Secondary | ICD-10-CM | POA: Diagnosis not present

## 2018-11-16 DIAGNOSIS — Z9011 Acquired absence of right breast and nipple: Secondary | ICD-10-CM | POA: Diagnosis not present

## 2018-11-16 DIAGNOSIS — F0391 Unspecified dementia with behavioral disturbance: Secondary | ICD-10-CM | POA: Diagnosis not present

## 2018-11-19 DIAGNOSIS — R2689 Other abnormalities of gait and mobility: Secondary | ICD-10-CM | POA: Diagnosis not present

## 2018-11-19 DIAGNOSIS — Z6826 Body mass index (BMI) 26.0-26.9, adult: Secondary | ICD-10-CM | POA: Diagnosis not present

## 2018-11-19 DIAGNOSIS — Z9011 Acquired absence of right breast and nipple: Secondary | ICD-10-CM | POA: Diagnosis not present

## 2018-11-19 DIAGNOSIS — I739 Peripheral vascular disease, unspecified: Secondary | ICD-10-CM | POA: Diagnosis not present

## 2018-11-19 DIAGNOSIS — M6281 Muscle weakness (generalized): Secondary | ICD-10-CM | POA: Diagnosis not present

## 2018-11-19 DIAGNOSIS — E039 Hypothyroidism, unspecified: Secondary | ICD-10-CM | POA: Diagnosis not present

## 2018-11-19 DIAGNOSIS — F0391 Unspecified dementia with behavioral disturbance: Secondary | ICD-10-CM | POA: Diagnosis not present

## 2018-11-19 DIAGNOSIS — R41841 Cognitive communication deficit: Secondary | ICD-10-CM | POA: Diagnosis not present

## 2018-11-19 DIAGNOSIS — F0390 Unspecified dementia without behavioral disturbance: Secondary | ICD-10-CM | POA: Diagnosis not present

## 2018-11-19 DIAGNOSIS — I10 Essential (primary) hypertension: Secondary | ICD-10-CM | POA: Diagnosis not present

## 2018-11-19 DIAGNOSIS — Z299 Encounter for prophylactic measures, unspecified: Secondary | ICD-10-CM | POA: Diagnosis not present

## 2018-11-22 DIAGNOSIS — R41841 Cognitive communication deficit: Secondary | ICD-10-CM | POA: Diagnosis not present

## 2018-11-22 DIAGNOSIS — I10 Essential (primary) hypertension: Secondary | ICD-10-CM | POA: Diagnosis not present

## 2018-11-22 DIAGNOSIS — Z9011 Acquired absence of right breast and nipple: Secondary | ICD-10-CM | POA: Diagnosis not present

## 2018-11-22 DIAGNOSIS — R2689 Other abnormalities of gait and mobility: Secondary | ICD-10-CM | POA: Diagnosis not present

## 2018-11-22 DIAGNOSIS — F0391 Unspecified dementia with behavioral disturbance: Secondary | ICD-10-CM | POA: Diagnosis not present

## 2018-11-22 DIAGNOSIS — M6281 Muscle weakness (generalized): Secondary | ICD-10-CM | POA: Diagnosis not present

## 2018-11-27 DIAGNOSIS — R2689 Other abnormalities of gait and mobility: Secondary | ICD-10-CM | POA: Diagnosis not present

## 2018-11-27 DIAGNOSIS — I10 Essential (primary) hypertension: Secondary | ICD-10-CM | POA: Diagnosis not present

## 2018-11-27 DIAGNOSIS — M6281 Muscle weakness (generalized): Secondary | ICD-10-CM | POA: Diagnosis not present

## 2018-11-27 DIAGNOSIS — Z9011 Acquired absence of right breast and nipple: Secondary | ICD-10-CM | POA: Diagnosis not present

## 2018-11-27 DIAGNOSIS — R41841 Cognitive communication deficit: Secondary | ICD-10-CM | POA: Diagnosis not present

## 2018-11-27 DIAGNOSIS — F0391 Unspecified dementia with behavioral disturbance: Secondary | ICD-10-CM | POA: Diagnosis not present

## 2018-11-28 DIAGNOSIS — Z9011 Acquired absence of right breast and nipple: Secondary | ICD-10-CM | POA: Diagnosis not present

## 2018-11-28 DIAGNOSIS — M6281 Muscle weakness (generalized): Secondary | ICD-10-CM | POA: Diagnosis not present

## 2018-11-28 DIAGNOSIS — F0391 Unspecified dementia with behavioral disturbance: Secondary | ICD-10-CM | POA: Diagnosis not present

## 2018-11-28 DIAGNOSIS — R41841 Cognitive communication deficit: Secondary | ICD-10-CM | POA: Diagnosis not present

## 2018-11-28 DIAGNOSIS — I10 Essential (primary) hypertension: Secondary | ICD-10-CM | POA: Diagnosis not present

## 2018-11-28 DIAGNOSIS — R2689 Other abnormalities of gait and mobility: Secondary | ICD-10-CM | POA: Diagnosis not present

## 2018-11-30 DIAGNOSIS — R41841 Cognitive communication deficit: Secondary | ICD-10-CM | POA: Diagnosis not present

## 2018-11-30 DIAGNOSIS — R2689 Other abnormalities of gait and mobility: Secondary | ICD-10-CM | POA: Diagnosis not present

## 2018-11-30 DIAGNOSIS — I10 Essential (primary) hypertension: Secondary | ICD-10-CM | POA: Diagnosis not present

## 2018-11-30 DIAGNOSIS — M6281 Muscle weakness (generalized): Secondary | ICD-10-CM | POA: Diagnosis not present

## 2018-11-30 DIAGNOSIS — F0391 Unspecified dementia with behavioral disturbance: Secondary | ICD-10-CM | POA: Diagnosis not present

## 2018-11-30 DIAGNOSIS — Z9011 Acquired absence of right breast and nipple: Secondary | ICD-10-CM | POA: Diagnosis not present

## 2018-12-19 DIAGNOSIS — R440 Auditory hallucinations: Secondary | ICD-10-CM | POA: Diagnosis not present

## 2018-12-19 DIAGNOSIS — I739 Peripheral vascular disease, unspecified: Secondary | ICD-10-CM | POA: Diagnosis not present

## 2018-12-19 DIAGNOSIS — Z299 Encounter for prophylactic measures, unspecified: Secondary | ICD-10-CM | POA: Diagnosis not present

## 2018-12-19 DIAGNOSIS — Z6826 Body mass index (BMI) 26.0-26.9, adult: Secondary | ICD-10-CM | POA: Diagnosis not present

## 2018-12-19 DIAGNOSIS — I10 Essential (primary) hypertension: Secondary | ICD-10-CM | POA: Diagnosis not present

## 2018-12-19 DIAGNOSIS — F0390 Unspecified dementia without behavioral disturbance: Secondary | ICD-10-CM | POA: Diagnosis not present

## 2018-12-19 DIAGNOSIS — R197 Diarrhea, unspecified: Secondary | ICD-10-CM | POA: Diagnosis not present

## 2018-12-28 DIAGNOSIS — Z9011 Acquired absence of right breast and nipple: Secondary | ICD-10-CM | POA: Diagnosis not present

## 2018-12-28 DIAGNOSIS — M6281 Muscle weakness (generalized): Secondary | ICD-10-CM | POA: Diagnosis not present

## 2018-12-28 DIAGNOSIS — R41841 Cognitive communication deficit: Secondary | ICD-10-CM | POA: Diagnosis not present

## 2018-12-28 DIAGNOSIS — R2689 Other abnormalities of gait and mobility: Secondary | ICD-10-CM | POA: Diagnosis not present

## 2018-12-28 DIAGNOSIS — I10 Essential (primary) hypertension: Secondary | ICD-10-CM | POA: Diagnosis not present

## 2018-12-28 DIAGNOSIS — F0391 Unspecified dementia with behavioral disturbance: Secondary | ICD-10-CM | POA: Diagnosis not present

## 2019-01-28 DIAGNOSIS — I10 Essential (primary) hypertension: Secondary | ICD-10-CM | POA: Diagnosis not present

## 2019-01-28 DIAGNOSIS — G309 Alzheimer's disease, unspecified: Secondary | ICD-10-CM | POA: Diagnosis not present

## 2019-01-28 DIAGNOSIS — M159 Polyosteoarthritis, unspecified: Secondary | ICD-10-CM | POA: Diagnosis not present

## 2019-03-04 ENCOUNTER — Telehealth: Payer: Self-pay | Admitting: *Deleted

## 2019-03-04 NOTE — Telephone Encounter (Signed)
   Primary Cardiologist:  Dr. Purvis Sheffield  Patient contacted.  History reviewed.  No symptoms to suggest any unstable cardiac conditions.  Based on discussion, with current pandemic situation, we will be postponing this appointment for July 2020.  If symptoms change, she has been instructed to contact our office.    Marland Kitchen

## 2019-03-05 ENCOUNTER — Ambulatory Visit: Payer: Medicare Other | Admitting: Cardiovascular Disease

## 2019-03-06 DIAGNOSIS — M159 Polyosteoarthritis, unspecified: Secondary | ICD-10-CM | POA: Diagnosis not present

## 2019-03-06 DIAGNOSIS — G309 Alzheimer's disease, unspecified: Secondary | ICD-10-CM | POA: Diagnosis not present

## 2019-03-06 DIAGNOSIS — I10 Essential (primary) hypertension: Secondary | ICD-10-CM | POA: Diagnosis not present

## 2019-03-08 ENCOUNTER — Other Ambulatory Visit: Payer: Self-pay | Admitting: Cardiovascular Disease

## 2019-04-04 DIAGNOSIS — M159 Polyosteoarthritis, unspecified: Secondary | ICD-10-CM | POA: Diagnosis not present

## 2019-04-04 DIAGNOSIS — G309 Alzheimer's disease, unspecified: Secondary | ICD-10-CM | POA: Diagnosis not present

## 2019-04-04 DIAGNOSIS — I10 Essential (primary) hypertension: Secondary | ICD-10-CM | POA: Diagnosis not present

## 2019-05-07 DIAGNOSIS — R413 Other amnesia: Secondary | ICD-10-CM | POA: Diagnosis not present

## 2019-05-07 DIAGNOSIS — F0390 Unspecified dementia without behavioral disturbance: Secondary | ICD-10-CM | POA: Diagnosis not present

## 2019-05-07 DIAGNOSIS — I10 Essential (primary) hypertension: Secondary | ICD-10-CM | POA: Diagnosis not present

## 2019-05-07 DIAGNOSIS — Z299 Encounter for prophylactic measures, unspecified: Secondary | ICD-10-CM | POA: Diagnosis not present

## 2019-05-07 DIAGNOSIS — G4700 Insomnia, unspecified: Secondary | ICD-10-CM | POA: Diagnosis not present

## 2019-06-11 DIAGNOSIS — I10 Essential (primary) hypertension: Secondary | ICD-10-CM | POA: Diagnosis not present

## 2019-06-11 DIAGNOSIS — R410 Disorientation, unspecified: Secondary | ICD-10-CM | POA: Diagnosis not present

## 2019-06-11 DIAGNOSIS — R350 Frequency of micturition: Secondary | ICD-10-CM | POA: Diagnosis not present

## 2019-06-11 DIAGNOSIS — R451 Restlessness and agitation: Secondary | ICD-10-CM | POA: Diagnosis not present

## 2019-06-11 DIAGNOSIS — Z299 Encounter for prophylactic measures, unspecified: Secondary | ICD-10-CM | POA: Diagnosis not present

## 2019-06-11 DIAGNOSIS — Z6827 Body mass index (BMI) 27.0-27.9, adult: Secondary | ICD-10-CM | POA: Diagnosis not present

## 2019-06-11 DIAGNOSIS — N390 Urinary tract infection, site not specified: Secondary | ICD-10-CM | POA: Diagnosis not present

## 2019-06-17 ENCOUNTER — Encounter: Payer: Self-pay | Admitting: Cardiovascular Disease

## 2019-06-17 ENCOUNTER — Telehealth (INDEPENDENT_AMBULATORY_CARE_PROVIDER_SITE_OTHER): Payer: Medicare Other | Admitting: Cardiovascular Disease

## 2019-06-17 ENCOUNTER — Telehealth: Payer: Self-pay | Admitting: *Deleted

## 2019-06-17 VITALS — BP 127/66 | HR 64 | Ht 62.0 in | Wt 146.8 lb

## 2019-06-17 DIAGNOSIS — E785 Hyperlipidemia, unspecified: Secondary | ICD-10-CM

## 2019-06-17 DIAGNOSIS — I252 Old myocardial infarction: Secondary | ICD-10-CM

## 2019-06-17 DIAGNOSIS — I25118 Atherosclerotic heart disease of native coronary artery with other forms of angina pectoris: Secondary | ICD-10-CM | POA: Diagnosis not present

## 2019-06-17 DIAGNOSIS — I10 Essential (primary) hypertension: Secondary | ICD-10-CM

## 2019-06-17 MED ORDER — ASPIRIN EC 81 MG PO TBEC: 81.0000 mg | DELAYED_RELEASE_TABLET | Freq: Every day | ORAL | 3 refills | Status: DC

## 2019-06-17 NOTE — Telephone Encounter (Signed)
Patient's niece [name] verbally consented for telehealth visits with [facility] and understands that her insurance company will be billed for the encounter.

## 2019-06-17 NOTE — Progress Notes (Signed)
Virtual Visit via Telephone Note   This visit type was conducted due to national recommendations for restrictions regarding the COVID-19 Pandemic (e.g. social distancing) in an effort to limit this patient's exposure and mitigate transmission in our community.  Due to her co-morbid illnesses, this patient is at least at moderate risk for complications without adequate follow up.  This format is felt to be most appropriate for this patient at this time.  The patient did not have access to video technology/had technical difficulties with video requiring transitioning to audio format only (telephone).  All issues noted in this document were discussed and addressed.  No physical exam could be performed with this format.  Please refer to the patient's chart for her  consent to telehealth for [facility].   Date:  06/17/2019   ID:  [name], DOB [id number], MRN [id number]  Patient Location: Home Provider Location: Office  PCP:  Glenda Chroman, MD  Cardiologist:  Kate Sable, MD  Electrophysiologist:  None   Evaluation Performed:  Follow-Up Visit  Chief Complaint:  CAD  History of Present Illness:    Michelle Sharp is a 85 y.o. female with coronary artery disease. She was hospitalized in 2018 for a non-STEMI with troponins peaking at 10.9. She was treated with aspirin, Lovenox, beta blocker, statin, and Plavix. There was apparently a discussion held with the family and the patient and they preferred not to undergo coronary angiography. She was treated medically. She is a DO NOT RESUSCITATE CODE STATUS. She has poor exercise tolerance.  She lives with her son in an apartment. He does all the cooking and cleaning.  I spoke with her niece as well. She denies chest pain and shortness of breath. She has some occasional feet swelling.  The patient does not have symptoms concerning for COVID-19 infection (fever, chills, cough, or new shortness of breath).    Past Medical History:   Diagnosis Date  . Hypertension    Past Surgical History:  Procedure Laterality Date  . CHOLECYSTECTOMY    . MASTECTOMY       Current Meds  Medication Sig  . atorvastatin (LIPITOR) 20 MG tablet Take 1 tablet (20 mg total) by mouth daily.  . clopidogrel (PLAVIX) 75 MG tablet TAKE (1) TABLET BY MOUTH ONCE DAILY.  Marland Kitchen donepezil (ARICEPT) 5 MG tablet Take 5 mg by mouth daily.  . metoprolol tartrate (LOPRESSOR) 25 MG tablet TAKE (1/2) TABLET BY MOUTH TWICE DAILY.  . nitroGLYCERIN (NITROSTAT) 0.4 MG SL tablet Place 1 tablet (0.4 mg total) under the tongue every 5 (five) minutes x 3 doses as needed for chest pain.  . pantoprazole (PROTONIX) 20 MG tablet TAKE (1) TABLET BY MOUTH ONCE DAILY.     Allergies:   Codeine   Social History   Tobacco Use  . Smoking status: Former Smoker    Quit date: 03/01/1987    Years since quitting: 32.3  . Smokeless tobacco: Never Used  Substance Use Topics  . Alcohol use: No  . Drug use: No     Family Hx: The patient's family history includes Cancer in her father and sister; Heart attack in her brother; Heart disease in her brother; High blood pressure in her mother.  ROS:   Please see the history of present illness.     All other systems reviewed and are negative.   Prior CV studies:   The following studies were reviewed today:  Echocardiogram 03/02/17:  Left ventricle: The cavity size was normal.  Wall thickness was increased in a pattern of mild LVH. Systolic function was normal. The estimated ejection fraction was in the range of 60% to 65%. Wall motion was normal; there were no regional wall motion abnormalities. Doppler parameters are consistent with abnormal left ventricular relaxation (grade 1 diastolic dysfunction). - Aortic valve: There was mild regurgitation. - Mitral valve: There was mild regurgitation. - Atrial septum: A patent foramen ovale cannot be excluded. - Tricuspid valve: There was moderate regurgitation. -  Pulmonary arteries: PA peak pressure: 39 mm Hg (S).  Labs/Other Tests and Data Reviewed:    EKG:  No ECG reviewed.  Recent Labs: No results found for requested labs within last 8760 hours.   Recent Lipid Panel Lab Results  Component Value Date/Time   CHOL 157 02/26/2017 11:15 PM   TRIG 125 02/26/2017 11:15 PM   HDL 44 02/26/2017 11:15 PM   CHOLHDL 3.6 02/26/2017 11:15 PM   LDLCALC 88 02/26/2017 11:15 PM    Wt Readings from Last 3 Encounters:  06/17/19 146 lb 12.8 oz (66.6 kg)  07/17/18 147 lb (66.7 kg)  01/11/18 141 lb (64 kg)     Objective:    Vital Signs:  BP 127/66   Pulse 64   Ht 5\' 2"  (1.575 m)   Wt 146 lb 12.8 oz (66.6 kg)   BMI 26.85 kg/m    VITAL SIGNS:  reviewed  ASSESSMENT & PLAN:    1. CAD with NSTEMI:Symptomatically stable. Left ventricular systolic function is normal.  Continue aspirin, low-dose metoprolol, and low-dose Lipitor. I will stop Plavix.  2. Hyperlipidemia:Continue Lipitor.  3. Hypertension: Blood pressure is normal.  No changes to therapy.   COVID-19 Education: The signs and symptoms of COVID-19 were discussed with the patient and how to seek care for testing (follow up with PCP or arrange E-visit).  The importance of social distancing was discussed today.  Time:   Today, I have spent 10 minutes with the patient with telehealth technology discussing the above problems.     Medication Adjustments/Labs and Tests Ordered: Current medicines are reviewed at length with the patient today.  Concerns regarding medicines are outlined above.   Tests Ordered: No orders of the defined types were placed in this encounter.   Medication Changes: No orders of the defined types were placed in this encounter.   Follow Up:  Virtual Visit or In Person in 1 year(s)  Signed, Prentice DockerSuresh Caiya Bettes, MD  06/17/2019 1:23 PM    [facility]

## 2019-06-17 NOTE — Patient Instructions (Addendum)
Medication Instructions:   Your physician has recommended you make the following change in your medication:   Finish your current supply of clopidogrel and stop it.   Start aspirin 81 mg by mouth daily once you finish your plavix (clopidogrel)  Continue all other medications the same  Labwork:  NONE  Testing/Procedures:  NONE  Follow-Up:  Your physician recommends that you schedule a follow-up appointment in: 1 year. You will receive a reminder letter in the mail in about 10 months reminding you to call and schedule your appointment. If you don't receive this letter, please contact our office.  Any Other Special Instructions Will Be Listed Below (If Applicable).  If you need a refill on your cardiac medications before your next appointment, please call your pharmacy.

## 2019-06-17 NOTE — Addendum Note (Signed)
Addended by: Merlene Laughter on: 06/17/2019 01:37 PM   Modules accepted: Orders

## 2019-06-24 DIAGNOSIS — M159 Polyosteoarthritis, unspecified: Secondary | ICD-10-CM | POA: Diagnosis not present

## 2019-06-24 DIAGNOSIS — I10 Essential (primary) hypertension: Secondary | ICD-10-CM | POA: Diagnosis not present

## 2019-06-24 DIAGNOSIS — G309 Alzheimer's disease, unspecified: Secondary | ICD-10-CM | POA: Diagnosis not present

## 2019-06-26 DIAGNOSIS — Z299 Encounter for prophylactic measures, unspecified: Secondary | ICD-10-CM | POA: Diagnosis not present

## 2019-06-26 DIAGNOSIS — R451 Restlessness and agitation: Secondary | ICD-10-CM | POA: Diagnosis not present

## 2019-06-26 DIAGNOSIS — R609 Edema, unspecified: Secondary | ICD-10-CM | POA: Diagnosis not present

## 2019-06-26 DIAGNOSIS — F0390 Unspecified dementia without behavioral disturbance: Secondary | ICD-10-CM | POA: Diagnosis not present

## 2019-06-26 DIAGNOSIS — R443 Hallucinations, unspecified: Secondary | ICD-10-CM | POA: Diagnosis not present

## 2019-07-15 DIAGNOSIS — I10 Essential (primary) hypertension: Secondary | ICD-10-CM | POA: Diagnosis not present

## 2019-07-15 DIAGNOSIS — G309 Alzheimer's disease, unspecified: Secondary | ICD-10-CM | POA: Diagnosis not present

## 2019-07-15 DIAGNOSIS — M159 Polyosteoarthritis, unspecified: Secondary | ICD-10-CM | POA: Diagnosis not present

## 2019-07-24 DIAGNOSIS — Z6826 Body mass index (BMI) 26.0-26.9, adult: Secondary | ICD-10-CM | POA: Diagnosis not present

## 2019-07-24 DIAGNOSIS — F0390 Unspecified dementia without behavioral disturbance: Secondary | ICD-10-CM | POA: Diagnosis not present

## 2019-07-24 DIAGNOSIS — Z299 Encounter for prophylactic measures, unspecified: Secondary | ICD-10-CM | POA: Diagnosis not present

## 2019-07-24 DIAGNOSIS — R443 Hallucinations, unspecified: Secondary | ICD-10-CM | POA: Diagnosis not present

## 2019-07-24 DIAGNOSIS — I10 Essential (primary) hypertension: Secondary | ICD-10-CM | POA: Diagnosis not present

## 2019-08-21 DIAGNOSIS — Z1339 Encounter for screening examination for other mental health and behavioral disorders: Secondary | ICD-10-CM | POA: Diagnosis not present

## 2019-08-21 DIAGNOSIS — I10 Essential (primary) hypertension: Secondary | ICD-10-CM | POA: Diagnosis not present

## 2019-08-21 DIAGNOSIS — Z299 Encounter for prophylactic measures, unspecified: Secondary | ICD-10-CM | POA: Diagnosis not present

## 2019-08-21 DIAGNOSIS — Z6826 Body mass index (BMI) 26.0-26.9, adult: Secondary | ICD-10-CM | POA: Diagnosis not present

## 2019-08-21 DIAGNOSIS — Z1331 Encounter for screening for depression: Secondary | ICD-10-CM | POA: Diagnosis not present

## 2019-08-21 DIAGNOSIS — Z0000 Encounter for general adult medical examination without abnormal findings: Secondary | ICD-10-CM | POA: Diagnosis not present

## 2019-08-21 DIAGNOSIS — Z7189 Other specified counseling: Secondary | ICD-10-CM | POA: Diagnosis not present

## 2019-08-23 DIAGNOSIS — I10 Essential (primary) hypertension: Secondary | ICD-10-CM | POA: Diagnosis not present

## 2019-08-23 DIAGNOSIS — M159 Polyosteoarthritis, unspecified: Secondary | ICD-10-CM | POA: Diagnosis not present

## 2019-08-23 DIAGNOSIS — G309 Alzheimer's disease, unspecified: Secondary | ICD-10-CM | POA: Diagnosis not present

## 2019-08-30 DIAGNOSIS — E039 Hypothyroidism, unspecified: Secondary | ICD-10-CM | POA: Diagnosis not present

## 2019-08-30 DIAGNOSIS — Z79899 Other long term (current) drug therapy: Secondary | ICD-10-CM | POA: Diagnosis not present

## 2019-08-30 DIAGNOSIS — R5383 Other fatigue: Secondary | ICD-10-CM | POA: Diagnosis not present

## 2019-08-30 DIAGNOSIS — E7800 Pure hypercholesterolemia, unspecified: Secondary | ICD-10-CM | POA: Diagnosis not present

## 2019-09-26 ENCOUNTER — Other Ambulatory Visit: Payer: Self-pay | Admitting: Cardiovascular Disease

## 2019-09-29 DIAGNOSIS — F29 Unspecified psychosis not due to a substance or known physiological condition: Secondary | ICD-10-CM | POA: Diagnosis not present

## 2019-09-29 DIAGNOSIS — G309 Alzheimer's disease, unspecified: Secondary | ICD-10-CM | POA: Diagnosis not present

## 2019-09-29 DIAGNOSIS — R509 Fever, unspecified: Secondary | ICD-10-CM | POA: Diagnosis not present

## 2019-09-29 DIAGNOSIS — R52 Pain, unspecified: Secondary | ICD-10-CM | POA: Diagnosis not present

## 2019-09-29 DIAGNOSIS — I959 Hypotension, unspecified: Secondary | ICD-10-CM | POA: Diagnosis not present

## 2019-09-29 DIAGNOSIS — K449 Diaphragmatic hernia without obstruction or gangrene: Secondary | ICD-10-CM | POA: Diagnosis not present

## 2019-09-29 DIAGNOSIS — R109 Unspecified abdominal pain: Secondary | ICD-10-CM | POA: Diagnosis not present

## 2019-09-29 DIAGNOSIS — R451 Restlessness and agitation: Secondary | ICD-10-CM | POA: Diagnosis not present

## 2019-09-29 DIAGNOSIS — Z20828 Contact with and (suspected) exposure to other viral communicable diseases: Secondary | ICD-10-CM | POA: Diagnosis present

## 2019-09-29 DIAGNOSIS — K219 Gastro-esophageal reflux disease without esophagitis: Secondary | ICD-10-CM | POA: Diagnosis present

## 2019-09-29 DIAGNOSIS — I10 Essential (primary) hypertension: Secondary | ICD-10-CM | POA: Diagnosis present

## 2019-09-29 DIAGNOSIS — R410 Disorientation, unspecified: Secondary | ICD-10-CM | POA: Diagnosis not present

## 2019-09-29 DIAGNOSIS — E860 Dehydration: Secondary | ICD-10-CM | POA: Diagnosis not present

## 2019-09-29 DIAGNOSIS — Z853 Personal history of malignant neoplasm of breast: Secondary | ICD-10-CM | POA: Diagnosis not present

## 2019-09-29 DIAGNOSIS — R102 Pelvic and perineal pain: Secondary | ICD-10-CM | POA: Diagnosis not present

## 2019-09-29 DIAGNOSIS — K5790 Diverticulosis of intestine, part unspecified, without perforation or abscess without bleeding: Secondary | ICD-10-CM | POA: Diagnosis not present

## 2019-09-29 DIAGNOSIS — E871 Hypo-osmolality and hyponatremia: Secondary | ICD-10-CM | POA: Diagnosis not present

## 2019-09-29 DIAGNOSIS — K5900 Constipation, unspecified: Secondary | ICD-10-CM | POA: Diagnosis not present

## 2019-09-29 DIAGNOSIS — M545 Low back pain: Secondary | ICD-10-CM | POA: Diagnosis not present

## 2019-09-29 DIAGNOSIS — N390 Urinary tract infection, site not specified: Secondary | ICD-10-CM | POA: Diagnosis present

## 2019-09-29 DIAGNOSIS — Z7401 Bed confinement status: Secondary | ICD-10-CM | POA: Diagnosis not present

## 2019-09-29 DIAGNOSIS — D72829 Elevated white blood cell count, unspecified: Secondary | ICD-10-CM | POA: Diagnosis not present

## 2019-09-29 DIAGNOSIS — R58 Hemorrhage, not elsewhere classified: Secondary | ICD-10-CM | POA: Diagnosis not present

## 2019-09-29 DIAGNOSIS — R2689 Other abnormalities of gait and mobility: Secondary | ICD-10-CM | POA: Diagnosis not present

## 2019-09-29 DIAGNOSIS — E876 Hypokalemia: Secondary | ICD-10-CM | POA: Diagnosis present

## 2019-09-29 DIAGNOSIS — R4182 Altered mental status, unspecified: Secondary | ICD-10-CM | POA: Diagnosis not present

## 2019-09-29 DIAGNOSIS — Z87891 Personal history of nicotine dependence: Secondary | ICD-10-CM | POA: Diagnosis not present

## 2019-09-29 DIAGNOSIS — F0391 Unspecified dementia with behavioral disturbance: Secondary | ICD-10-CM | POA: Diagnosis present

## 2019-09-29 DIAGNOSIS — M6281 Muscle weakness (generalized): Secondary | ICD-10-CM | POA: Diagnosis not present

## 2019-09-29 DIAGNOSIS — R41841 Cognitive communication deficit: Secondary | ICD-10-CM | POA: Diagnosis not present

## 2019-09-29 DIAGNOSIS — R519 Headache, unspecified: Secondary | ICD-10-CM | POA: Diagnosis not present

## 2019-10-05 DIAGNOSIS — F29 Unspecified psychosis not due to a substance or known physiological condition: Secondary | ICD-10-CM | POA: Diagnosis not present

## 2019-10-05 DIAGNOSIS — R410 Disorientation, unspecified: Secondary | ICD-10-CM | POA: Diagnosis not present

## 2019-10-05 DIAGNOSIS — E860 Dehydration: Secondary | ICD-10-CM | POA: Diagnosis not present

## 2019-10-05 DIAGNOSIS — F0280 Dementia in other diseases classified elsewhere without behavioral disturbance: Secondary | ICD-10-CM | POA: Diagnosis not present

## 2019-10-05 DIAGNOSIS — Z7401 Bed confinement status: Secondary | ICD-10-CM | POA: Diagnosis not present

## 2019-10-05 DIAGNOSIS — R2243 Localized swelling, mass and lump, lower limb, bilateral: Secondary | ICD-10-CM | POA: Diagnosis not present

## 2019-10-05 DIAGNOSIS — M6281 Muscle weakness (generalized): Secondary | ICD-10-CM | POA: Diagnosis not present

## 2019-10-05 DIAGNOSIS — F0390 Unspecified dementia without behavioral disturbance: Secondary | ICD-10-CM | POA: Diagnosis not present

## 2019-10-05 DIAGNOSIS — I509 Heart failure, unspecified: Secondary | ICD-10-CM | POA: Diagnosis not present

## 2019-10-05 DIAGNOSIS — M545 Low back pain: Secondary | ICD-10-CM | POA: Diagnosis not present

## 2019-10-05 DIAGNOSIS — R609 Edema, unspecified: Secondary | ICD-10-CM | POA: Diagnosis not present

## 2019-10-05 DIAGNOSIS — E876 Hypokalemia: Secondary | ICD-10-CM | POA: Diagnosis not present

## 2019-10-05 DIAGNOSIS — R4182 Altered mental status, unspecified: Secondary | ICD-10-CM | POA: Diagnosis not present

## 2019-10-05 DIAGNOSIS — M25512 Pain in left shoulder: Secondary | ICD-10-CM | POA: Diagnosis not present

## 2019-10-05 DIAGNOSIS — R41841 Cognitive communication deficit: Secondary | ICD-10-CM | POA: Diagnosis not present

## 2019-10-05 DIAGNOSIS — Z20828 Contact with and (suspected) exposure to other viral communicable diseases: Secondary | ICD-10-CM | POA: Diagnosis not present

## 2019-10-05 DIAGNOSIS — I10 Essential (primary) hypertension: Secondary | ICD-10-CM | POA: Diagnosis not present

## 2019-10-05 DIAGNOSIS — E871 Hypo-osmolality and hyponatremia: Secondary | ICD-10-CM | POA: Diagnosis not present

## 2019-10-05 DIAGNOSIS — G309 Alzheimer's disease, unspecified: Secondary | ICD-10-CM | POA: Diagnosis not present

## 2019-10-05 DIAGNOSIS — R2689 Other abnormalities of gait and mobility: Secondary | ICD-10-CM | POA: Diagnosis not present

## 2019-10-05 DIAGNOSIS — Z8639 Personal history of other endocrine, nutritional and metabolic disease: Secondary | ICD-10-CM | POA: Diagnosis not present

## 2019-10-05 DIAGNOSIS — N390 Urinary tract infection, site not specified: Secondary | ICD-10-CM | POA: Diagnosis not present

## 2019-10-05 DIAGNOSIS — E875 Hyperkalemia: Secondary | ICD-10-CM | POA: Diagnosis not present

## 2019-10-05 DIAGNOSIS — F0391 Unspecified dementia with behavioral disturbance: Secondary | ICD-10-CM | POA: Diagnosis not present

## 2019-10-05 DIAGNOSIS — R451 Restlessness and agitation: Secondary | ICD-10-CM | POA: Diagnosis not present

## 2019-10-07 DIAGNOSIS — R4182 Altered mental status, unspecified: Secondary | ICD-10-CM | POA: Diagnosis not present

## 2019-10-07 DIAGNOSIS — N390 Urinary tract infection, site not specified: Secondary | ICD-10-CM | POA: Diagnosis not present

## 2019-10-07 DIAGNOSIS — F0391 Unspecified dementia with behavioral disturbance: Secondary | ICD-10-CM | POA: Diagnosis not present

## 2019-10-07 DIAGNOSIS — E876 Hypokalemia: Secondary | ICD-10-CM | POA: Diagnosis not present

## 2019-10-17 DIAGNOSIS — N390 Urinary tract infection, site not specified: Secondary | ICD-10-CM | POA: Diagnosis not present

## 2019-10-17 DIAGNOSIS — E875 Hyperkalemia: Secondary | ICD-10-CM | POA: Diagnosis not present

## 2019-10-17 DIAGNOSIS — R410 Disorientation, unspecified: Secondary | ICD-10-CM | POA: Diagnosis not present

## 2019-10-17 DIAGNOSIS — G309 Alzheimer's disease, unspecified: Secondary | ICD-10-CM | POA: Diagnosis not present

## 2019-10-22 DIAGNOSIS — Z8639 Personal history of other endocrine, nutritional and metabolic disease: Secondary | ICD-10-CM | POA: Diagnosis not present

## 2019-10-22 DIAGNOSIS — R609 Edema, unspecified: Secondary | ICD-10-CM | POA: Diagnosis not present

## 2019-10-22 DIAGNOSIS — F0390 Unspecified dementia without behavioral disturbance: Secondary | ICD-10-CM | POA: Diagnosis not present

## 2019-10-24 ENCOUNTER — Other Ambulatory Visit: Payer: Self-pay | Admitting: *Deleted

## 2019-10-24 DIAGNOSIS — I509 Heart failure, unspecified: Secondary | ICD-10-CM | POA: Diagnosis not present

## 2019-10-24 DIAGNOSIS — R2243 Localized swelling, mass and lump, lower limb, bilateral: Secondary | ICD-10-CM | POA: Diagnosis not present

## 2019-10-24 DIAGNOSIS — F0280 Dementia in other diseases classified elsewhere without behavioral disturbance: Secondary | ICD-10-CM | POA: Diagnosis not present

## 2019-10-24 DIAGNOSIS — G309 Alzheimer's disease, unspecified: Secondary | ICD-10-CM | POA: Diagnosis not present

## 2019-10-24 NOTE — Patient Outreach (Signed)
Member assessed for potential [facility] Care Management needs as a benefit of  [place] Medicare.  Member is currently receiving rehab therapy at [facility].  Member discussed in weekly telephonic IDT meeting with facility staff, [facility] UM team, and writer.  Facility reports member is incontinent and is not walking. Per facility staff, member will need long term placement at SNF discharge. Likely dc next week.  Will continue to follow for disposition plans while member resides in SNF.    [name], MSN-Ed, RN,BSN [place] Acute Care Coordinator [phone] [facility]) [phone]  (Toll free office)

## 2019-10-29 DIAGNOSIS — R609 Edema, unspecified: Secondary | ICD-10-CM | POA: Diagnosis not present

## 2019-10-29 DIAGNOSIS — E876 Hypokalemia: Secondary | ICD-10-CM | POA: Diagnosis not present

## 2019-10-29 DIAGNOSIS — F0390 Unspecified dementia without behavioral disturbance: Secondary | ICD-10-CM | POA: Diagnosis not present

## 2019-10-30 DIAGNOSIS — E876 Hypokalemia: Secondary | ICD-10-CM | POA: Diagnosis not present

## 2019-10-30 DIAGNOSIS — R609 Edema, unspecified: Secondary | ICD-10-CM | POA: Diagnosis not present

## 2019-10-30 DIAGNOSIS — F0390 Unspecified dementia without behavioral disturbance: Secondary | ICD-10-CM | POA: Diagnosis not present

## 2019-10-31 ENCOUNTER — Other Ambulatory Visit: Payer: Self-pay | Admitting: *Deleted

## 2019-10-31 ENCOUNTER — Other Ambulatory Visit: Payer: Self-pay

## 2019-10-31 DIAGNOSIS — I10 Essential (primary) hypertension: Secondary | ICD-10-CM

## 2019-10-31 NOTE — Patient Outreach (Signed)
New referral for care coordination post rehab discharge  Placed call to patient and spoke with Ms. Tamala Julian ( sister- who is contact for patient). Ms. Tamala Julian reports that patient is doing well. Reports patient was discharged with a wheelchair and a walker. Reports son lives with patient. Son and sister are both taking care of patient. Sister reports that medications are bubbled packed by [facility]. Reports son administers medications. Sister reports son assist patient to bathroom.    Sister reports patient has a cough but no fever. Reports patient has a video visit with MD on 11/06/2019.  Sister reports patient was able to get in and out of car yesterday but reports weakness.   Reviewed with sister that [place] was ordered through [place], sister reports she has not heard from home health yet. Placed call to home health and spoke with Devita who reports home health nurse, PT, OT, ST and bath aid ordered and services will start tomorrow on 11/01/2019. I called sister back and informed.  Sister reports to me that she would like a Education officer, museum to assist with Medicaid application and caregiver services that might be available.   Denies any other needs at this time.  Plan: Coordinated care with home health and sister Ordered [facility] social worker to assist with Medicaid application and caregiver services.  No nursing needs at this time. Please re refer if needed.  Tomasa Rand, RN, BSN, CEN [facility] [place] [employer] [phone]

## 2019-10-31 NOTE — Patient Outreach (Signed)
Member assessed for potential [facility] Care Management needs as a benefit of [place] Medicare.  Per Patient Michelle Sharp member discharged from [facility] on 10/30/19 to home instead of long term care. Per [facility] UM documentation, member will have [facility] home health. Member lived with son. However, member's sister and niece assist with member's needs per [facility] UM documentation. Facility previously reported that [name] niece was supposed to apply for Medicaid.  Telephone call made to Mrs. Bolick niece [name] at [phone]. No answer. HIPAA compliant voicemail message left requesting call back. Telephone call made to home phone at [phone]. Someone answered the phone but hung up.   Will make referral for [place] Management services for care coordination. Member is high risk for readmission.   [name], MSN-Ed, RN,BSN [place] Acute Care Coordinator [phone] [facility]) [phone]  (Toll free office)

## 2019-11-01 ENCOUNTER — Other Ambulatory Visit: Payer: Self-pay

## 2019-11-01 DIAGNOSIS — Z87440 Personal history of urinary (tract) infections: Secondary | ICD-10-CM | POA: Diagnosis not present

## 2019-11-01 DIAGNOSIS — I509 Heart failure, unspecified: Secondary | ICD-10-CM | POA: Diagnosis not present

## 2019-11-01 DIAGNOSIS — I110 Hypertensive heart disease with heart failure: Secondary | ICD-10-CM | POA: Diagnosis not present

## 2019-11-01 DIAGNOSIS — G309 Alzheimer's disease, unspecified: Secondary | ICD-10-CM | POA: Diagnosis not present

## 2019-11-01 DIAGNOSIS — K219 Gastro-esophageal reflux disease without esophagitis: Secondary | ICD-10-CM | POA: Diagnosis not present

## 2019-11-01 DIAGNOSIS — E039 Hypothyroidism, unspecified: Secondary | ICD-10-CM | POA: Diagnosis not present

## 2019-11-01 DIAGNOSIS — F0280 Dementia in other diseases classified elsewhere without behavioral disturbance: Secondary | ICD-10-CM | POA: Diagnosis not present

## 2019-11-01 DIAGNOSIS — M150 Primary generalized (osteo)arthritis: Secondary | ICD-10-CM | POA: Diagnosis not present

## 2019-11-01 DIAGNOSIS — M810 Age-related osteoporosis without current pathological fracture: Secondary | ICD-10-CM | POA: Diagnosis not present

## 2019-11-01 DIAGNOSIS — K5792 Diverticulitis of intestine, part unspecified, without perforation or abscess without bleeding: Secondary | ICD-10-CM | POA: Diagnosis not present

## 2019-11-01 DIAGNOSIS — Z9181 History of falling: Secondary | ICD-10-CM | POA: Diagnosis not present

## 2019-11-01 DIAGNOSIS — I739 Peripheral vascular disease, unspecified: Secondary | ICD-10-CM | POA: Diagnosis not present

## 2019-11-01 NOTE — Patient Outreach (Signed)
[place] [facility]) Care Management  11/01/2019  Michelle Sharp February 25, 1926 [id number]   Social work referral received today from [facility], Tomasa Rand, to outreach regarding caregiver support and Medicaid application.   Unsuccessful outreach today.  No answer or option to leave message on home number.  Left message on mobile number which is also listed as mobile for patient's niece, [name]. Mailed unsuccessful outreach letter.  Will attempt to reach again if no return call within four business days.  Ronn Melena, BSW Social Worker [phone]

## 2019-11-04 ENCOUNTER — Other Ambulatory Visit: Payer: Self-pay

## 2019-11-04 NOTE — Patient Outreach (Signed)
[place] [facility]) Care Management  11/04/2019  Michelle Sharp 06/11/1926 [id number]   Social work referral received on 11/01/19 from [facility], Tomasa Rand, to outreach regarding caregiver support and Medicaid application. Successful outreach to patient's niece, (contact for patient per referral) [name] today. Informed niece that aide services will only be covered by Medicare while in conjunction with skilled services.  Talked with niece about Medicaid eligibility.  Based on information provided, niece does not feel that patient will qualify.  Informed niece that private pay is only option at this time if patient does not have Medicaid.  Talked with niece about assessment process and provided her with typical price range for services.  Provided niece with contact information for [facility] because they recently did have availability in the [facility] area and seem to have lowest fee for services.  Niece was appreciative of information and stated that she will call them to inquire about services.  Will follow up with her next week.  Ronn Melena, BSW Social Worker [phone]

## 2019-11-05 ENCOUNTER — Other Ambulatory Visit: Payer: Self-pay | Admitting: *Deleted

## 2019-11-05 ENCOUNTER — Encounter: Payer: Self-pay | Admitting: *Deleted

## 2019-11-05 NOTE — Patient Outreach (Signed)
[place] [facility]) Care Management [facility] CM Telephone Outreach EMMI Red-Alert notification/ General Discharge  11/05/2019  Michelle Sharp 25-Jul-1926 [id number]  EMMI Red Alert Notification- General Discharge EMMI call date/ day: Monday 11/04/2019; call day # 4 Red Alert reason: has not read discharge papers/ does not know who to call for changes in condition  Received EMMI red alert referral for Michelle Sharp, 85 y/o female recently discharged from SNF for rehabilitation; Patient has history including, but not limited to, HLD/ HTN; previous MI; and dementia.  Patient's caregiver/ sister- niece Michelle Sharp was recently contacted by [facility] RN CM to engage in [place], however, caregiver declined; [facility] CSW team currently active.  Spoke with patient's son/ caregiver Michelle Sharp today to address EMMI Red- alert; Michelle Sharp reports that patient is doing okay and having no problems.  Michelle Sharp agrees to screening; however, during our call, he reports that patient needs assistance in getting to the bathroom, and stated he must go assist her- EMMI red items addressed, full screening partially completed.  During our call, Michelle Sharp states that primary caregiver is Michelle Sharp, who is not present at the home during our phone call for me to speak with. Michelle Sharp is able to locate post- SNF discharge papers and confirms that caregiver Michelle Sharp has read these and that he/ caregiver Michelle Sharp understand plan of care for patient post- SNF discharge.  He further confirms that patient is scheduled for virtual visit with her PCP tomorrow, and that both he and Michelle Sharp will also attend/ be present for visit.  Michelle Sharp denies concerns around patient's condition and confirms that she continues taking her medications as prescribed with use of blister packaging from outpatient pharmacy.    Prior to [place] ending our call to assist patient, I confirmed that he has been mailed [facility] CM letters and shared with him that I would put a  letter in the mail today in case he/ caregiver Michelle Sharp should be interested in [place] services for patient in the future.   Plan:  Will place [facility] CM successful outreach letter in mail to patient/ caregiver should they be interested in [facility] CM services in the future    Will close EMMI- Red case as caregivers decline [facility] CM services  Oneta Rack, RN, BSN, [place] Coordinator [facility] Care Management  [phone]

## 2019-11-12 DIAGNOSIS — E860 Dehydration: Secondary | ICD-10-CM | POA: Diagnosis not present

## 2019-11-14 ENCOUNTER — Other Ambulatory Visit: Payer: Self-pay

## 2019-11-14 NOTE — Patient Outreach (Signed)
[place] [facility]) Care Management  11/14/2019  Michelle Sharp 1926-01-28 [id number]   Follow up call to patient's niece, [name] today.  Niece reports that patient is doing well at this time.  She did not contact [place] regarding private pay aide services as patient/family cannot afford cost.  Per niece, patient is over income limit to qualify for Medicaid.  Niece was appreciative for follow up call but denied need for further assistance for patient at this time.  Per note from [facility], [name] on 11/05/19 patient's son declined [facility] CM services.  Closing case at this time.   Ronn Melena, BSW Social Worker [phone]

## 2019-11-29 DIAGNOSIS — I10 Essential (primary) hypertension: Secondary | ICD-10-CM | POA: Diagnosis not present

## 2019-11-29 DIAGNOSIS — M159 Polyosteoarthritis, unspecified: Secondary | ICD-10-CM | POA: Diagnosis not present

## 2019-11-29 DIAGNOSIS — G309 Alzheimer's disease, unspecified: Secondary | ICD-10-CM | POA: Diagnosis not present

## 2019-12-01 DIAGNOSIS — G309 Alzheimer's disease, unspecified: Secondary | ICD-10-CM | POA: Diagnosis not present

## 2019-12-01 DIAGNOSIS — E039 Hypothyroidism, unspecified: Secondary | ICD-10-CM | POA: Diagnosis not present

## 2019-12-01 DIAGNOSIS — M150 Primary generalized (osteo)arthritis: Secondary | ICD-10-CM | POA: Diagnosis not present

## 2019-12-01 DIAGNOSIS — F0280 Dementia in other diseases classified elsewhere without behavioral disturbance: Secondary | ICD-10-CM | POA: Diagnosis not present

## 2019-12-01 DIAGNOSIS — I110 Hypertensive heart disease with heart failure: Secondary | ICD-10-CM | POA: Diagnosis not present

## 2019-12-01 DIAGNOSIS — M810 Age-related osteoporosis without current pathological fracture: Secondary | ICD-10-CM | POA: Diagnosis not present

## 2019-12-01 DIAGNOSIS — K5792 Diverticulitis of intestine, part unspecified, without perforation or abscess without bleeding: Secondary | ICD-10-CM | POA: Diagnosis not present

## 2019-12-01 DIAGNOSIS — I509 Heart failure, unspecified: Secondary | ICD-10-CM | POA: Diagnosis not present

## 2019-12-01 DIAGNOSIS — Z87440 Personal history of urinary (tract) infections: Secondary | ICD-10-CM | POA: Diagnosis not present

## 2019-12-01 DIAGNOSIS — I739 Peripheral vascular disease, unspecified: Secondary | ICD-10-CM | POA: Diagnosis not present

## 2019-12-01 DIAGNOSIS — Z9181 History of falling: Secondary | ICD-10-CM | POA: Diagnosis not present

## 2019-12-01 DIAGNOSIS — K219 Gastro-esophageal reflux disease without esophagitis: Secondary | ICD-10-CM | POA: Diagnosis not present

## 2019-12-02 DIAGNOSIS — G309 Alzheimer's disease, unspecified: Secondary | ICD-10-CM | POA: Diagnosis not present

## 2019-12-02 DIAGNOSIS — I739 Peripheral vascular disease, unspecified: Secondary | ICD-10-CM | POA: Diagnosis not present

## 2019-12-02 DIAGNOSIS — M150 Primary generalized (osteo)arthritis: Secondary | ICD-10-CM | POA: Diagnosis not present

## 2019-12-02 DIAGNOSIS — I110 Hypertensive heart disease with heart failure: Secondary | ICD-10-CM | POA: Diagnosis not present

## 2019-12-02 DIAGNOSIS — F0280 Dementia in other diseases classified elsewhere without behavioral disturbance: Secondary | ICD-10-CM | POA: Diagnosis not present

## 2019-12-02 DIAGNOSIS — I509 Heart failure, unspecified: Secondary | ICD-10-CM | POA: Diagnosis not present

## 2019-12-06 DIAGNOSIS — R4182 Altered mental status, unspecified: Secondary | ICD-10-CM | POA: Diagnosis not present

## 2019-12-06 DIAGNOSIS — M6281 Muscle weakness (generalized): Secondary | ICD-10-CM | POA: Diagnosis not present

## 2019-12-06 DIAGNOSIS — F0391 Unspecified dementia with behavioral disturbance: Secondary | ICD-10-CM | POA: Diagnosis not present

## 2019-12-06 DIAGNOSIS — Z66 Do not resuscitate: Secondary | ICD-10-CM | POA: Diagnosis present

## 2019-12-06 DIAGNOSIS — K5900 Constipation, unspecified: Secondary | ICD-10-CM | POA: Diagnosis not present

## 2019-12-06 DIAGNOSIS — I739 Peripheral vascular disease, unspecified: Secondary | ICD-10-CM | POA: Diagnosis not present

## 2019-12-06 DIAGNOSIS — Z9181 History of falling: Secondary | ICD-10-CM | POA: Diagnosis not present

## 2019-12-06 DIAGNOSIS — Z20828 Contact with and (suspected) exposure to other viral communicable diseases: Secondary | ICD-10-CM | POA: Diagnosis not present

## 2019-12-06 DIAGNOSIS — M545 Low back pain: Secondary | ICD-10-CM | POA: Diagnosis not present

## 2019-12-06 DIAGNOSIS — B9620 Unspecified Escherichia coli [E. coli] as the cause of diseases classified elsewhere: Secondary | ICD-10-CM | POA: Diagnosis present

## 2019-12-06 DIAGNOSIS — E785 Hyperlipidemia, unspecified: Secondary | ICD-10-CM | POA: Diagnosis not present

## 2019-12-06 DIAGNOSIS — I10 Essential (primary) hypertension: Secondary | ICD-10-CM | POA: Diagnosis present

## 2019-12-06 DIAGNOSIS — R1312 Dysphagia, oropharyngeal phase: Secondary | ICD-10-CM | POA: Diagnosis not present

## 2019-12-06 DIAGNOSIS — E860 Dehydration: Secondary | ICD-10-CM | POA: Diagnosis present

## 2019-12-06 DIAGNOSIS — N390 Urinary tract infection, site not specified: Secondary | ICD-10-CM | POA: Diagnosis present

## 2019-12-06 DIAGNOSIS — R410 Disorientation, unspecified: Secondary | ICD-10-CM | POA: Diagnosis not present

## 2019-12-06 DIAGNOSIS — Z743 Need for continuous supervision: Secondary | ICD-10-CM | POA: Diagnosis not present

## 2019-12-06 DIAGNOSIS — M818 Other osteoporosis without current pathological fracture: Secondary | ICD-10-CM | POA: Diagnosis not present

## 2019-12-06 DIAGNOSIS — Z853 Personal history of malignant neoplasm of breast: Secondary | ICD-10-CM | POA: Diagnosis not present

## 2019-12-06 DIAGNOSIS — K219 Gastro-esophageal reflux disease without esophagitis: Secondary | ICD-10-CM | POA: Diagnosis not present

## 2019-12-06 DIAGNOSIS — Z87891 Personal history of nicotine dependence: Secondary | ICD-10-CM | POA: Diagnosis not present

## 2019-12-06 DIAGNOSIS — R262 Difficulty in walking, not elsewhere classified: Secondary | ICD-10-CM | POA: Diagnosis not present

## 2019-12-06 DIAGNOSIS — R0902 Hypoxemia: Secondary | ICD-10-CM | POA: Diagnosis not present

## 2019-12-06 DIAGNOSIS — K5750 Diverticulosis of both small and large intestine without perforation or abscess without bleeding: Secondary | ICD-10-CM | POA: Diagnosis not present

## 2019-12-06 DIAGNOSIS — Z741 Need for assistance with personal care: Secondary | ICD-10-CM | POA: Diagnosis not present

## 2019-12-06 DIAGNOSIS — S2231XS Fracture of one rib, right side, sequela: Secondary | ICD-10-CM | POA: Diagnosis not present

## 2019-12-06 DIAGNOSIS — F0390 Unspecified dementia without behavioral disturbance: Secondary | ICD-10-CM | POA: Diagnosis not present

## 2019-12-06 DIAGNOSIS — U071 COVID-19: Secondary | ICD-10-CM | POA: Diagnosis not present

## 2019-12-06 DIAGNOSIS — R456 Violent behavior: Secondary | ICD-10-CM | POA: Diagnosis not present

## 2019-12-06 DIAGNOSIS — N179 Acute kidney failure, unspecified: Secondary | ICD-10-CM | POA: Diagnosis not present

## 2019-12-06 DIAGNOSIS — E039 Hypothyroidism, unspecified: Secondary | ICD-10-CM | POA: Diagnosis not present

## 2019-12-23 DIAGNOSIS — I959 Hypotension, unspecified: Secondary | ICD-10-CM | POA: Diagnosis not present

## 2019-12-23 DIAGNOSIS — W19XXXA Unspecified fall, initial encounter: Secondary | ICD-10-CM | POA: Diagnosis not present

## 2019-12-23 DIAGNOSIS — S72002A Fracture of unspecified part of neck of left femur, initial encounter for closed fracture: Secondary | ICD-10-CM | POA: Diagnosis not present

## 2019-12-23 DIAGNOSIS — R1312 Dysphagia, oropharyngeal phase: Secondary | ICD-10-CM | POA: Diagnosis not present

## 2019-12-23 DIAGNOSIS — E782 Mixed hyperlipidemia: Secondary | ICD-10-CM | POA: Diagnosis present

## 2019-12-23 DIAGNOSIS — R404 Transient alteration of awareness: Secondary | ICD-10-CM | POA: Diagnosis not present

## 2019-12-23 DIAGNOSIS — K5750 Diverticulosis of both small and large intestine without perforation or abscess without bleeding: Secondary | ICD-10-CM | POA: Diagnosis not present

## 2019-12-23 DIAGNOSIS — F0390 Unspecified dementia without behavioral disturbance: Secondary | ICD-10-CM | POA: Diagnosis not present

## 2019-12-23 DIAGNOSIS — E860 Dehydration: Secondary | ICD-10-CM | POA: Diagnosis present

## 2019-12-23 DIAGNOSIS — J9811 Atelectasis: Secondary | ICD-10-CM | POA: Diagnosis not present

## 2019-12-23 DIAGNOSIS — N3000 Acute cystitis without hematuria: Secondary | ICD-10-CM | POA: Diagnosis not present

## 2019-12-23 DIAGNOSIS — N1831 Chronic kidney disease, stage 3a: Secondary | ICD-10-CM | POA: Diagnosis present

## 2019-12-23 DIAGNOSIS — M25552 Pain in left hip: Secondary | ICD-10-CM | POA: Diagnosis not present

## 2019-12-23 DIAGNOSIS — E785 Hyperlipidemia, unspecified: Secondary | ICD-10-CM | POA: Diagnosis not present

## 2019-12-23 DIAGNOSIS — R5381 Other malaise: Secondary | ICD-10-CM | POA: Diagnosis not present

## 2019-12-23 DIAGNOSIS — G9341 Metabolic encephalopathy: Secondary | ICD-10-CM | POA: Diagnosis present

## 2019-12-23 DIAGNOSIS — N179 Acute kidney failure, unspecified: Secondary | ICD-10-CM | POA: Diagnosis not present

## 2019-12-23 DIAGNOSIS — I252 Old myocardial infarction: Secondary | ICD-10-CM | POA: Diagnosis not present

## 2019-12-23 DIAGNOSIS — Z9181 History of falling: Secondary | ICD-10-CM | POA: Diagnosis not present

## 2019-12-23 DIAGNOSIS — M6281 Muscle weakness (generalized): Secondary | ICD-10-CM | POA: Diagnosis not present

## 2019-12-23 DIAGNOSIS — K219 Gastro-esophageal reflux disease without esophagitis: Secondary | ICD-10-CM | POA: Diagnosis not present

## 2019-12-23 DIAGNOSIS — Z743 Need for continuous supervision: Secondary | ICD-10-CM | POA: Diagnosis not present

## 2019-12-23 DIAGNOSIS — R262 Difficulty in walking, not elsewhere classified: Secondary | ICD-10-CM | POA: Diagnosis not present

## 2019-12-23 DIAGNOSIS — Z66 Do not resuscitate: Secondary | ICD-10-CM | POA: Diagnosis present

## 2019-12-23 DIAGNOSIS — R000 Tachycardia, unspecified: Secondary | ICD-10-CM | POA: Diagnosis not present

## 2019-12-23 DIAGNOSIS — Z809 Family history of malignant neoplasm, unspecified: Secondary | ICD-10-CM | POA: Diagnosis not present

## 2019-12-23 DIAGNOSIS — S2231XS Fracture of one rib, right side, sequela: Secondary | ICD-10-CM | POA: Diagnosis not present

## 2019-12-23 DIAGNOSIS — R456 Violent behavior: Secondary | ICD-10-CM | POA: Diagnosis not present

## 2019-12-23 DIAGNOSIS — M545 Low back pain: Secondary | ICD-10-CM | POA: Diagnosis not present

## 2019-12-23 DIAGNOSIS — Z741 Need for assistance with personal care: Secondary | ICD-10-CM | POA: Diagnosis not present

## 2019-12-23 DIAGNOSIS — I10 Essential (primary) hypertension: Secondary | ICD-10-CM | POA: Diagnosis not present

## 2019-12-23 DIAGNOSIS — S72142A Displaced intertrochanteric fracture of left femur, initial encounter for closed fracture: Secondary | ICD-10-CM | POA: Diagnosis not present

## 2019-12-23 DIAGNOSIS — R4182 Altered mental status, unspecified: Secondary | ICD-10-CM | POA: Diagnosis not present

## 2019-12-23 DIAGNOSIS — R52 Pain, unspecified: Secondary | ICD-10-CM | POA: Diagnosis not present

## 2019-12-23 DIAGNOSIS — S72412A Displaced unspecified condyle fracture of lower end of left femur, initial encounter for closed fracture: Secondary | ICD-10-CM | POA: Diagnosis not present

## 2019-12-23 DIAGNOSIS — R410 Disorientation, unspecified: Secondary | ICD-10-CM | POA: Diagnosis not present

## 2019-12-23 DIAGNOSIS — R0902 Hypoxemia: Secondary | ICD-10-CM | POA: Diagnosis not present

## 2019-12-23 DIAGNOSIS — F0391 Unspecified dementia with behavioral disturbance: Secondary | ICD-10-CM | POA: Diagnosis not present

## 2019-12-23 DIAGNOSIS — Z7982 Long term (current) use of aspirin: Secondary | ICD-10-CM | POA: Diagnosis not present

## 2019-12-23 DIAGNOSIS — M818 Other osteoporosis without current pathological fracture: Secondary | ICD-10-CM | POA: Diagnosis not present

## 2019-12-23 DIAGNOSIS — Z79899 Other long term (current) drug therapy: Secondary | ICD-10-CM | POA: Diagnosis not present

## 2019-12-23 DIAGNOSIS — Z885 Allergy status to narcotic agent status: Secondary | ICD-10-CM | POA: Diagnosis not present

## 2019-12-23 DIAGNOSIS — I129 Hypertensive chronic kidney disease with stage 1 through stage 4 chronic kidney disease, or unspecified chronic kidney disease: Secondary | ICD-10-CM | POA: Diagnosis present

## 2019-12-23 DIAGNOSIS — E039 Hypothyroidism, unspecified: Secondary | ICD-10-CM | POA: Diagnosis not present

## 2019-12-23 DIAGNOSIS — Y92099 Unspecified place in other non-institutional residence as the place of occurrence of the external cause: Secondary | ICD-10-CM | POA: Diagnosis not present

## 2019-12-23 DIAGNOSIS — D649 Anemia, unspecified: Secondary | ICD-10-CM | POA: Diagnosis not present

## 2019-12-23 DIAGNOSIS — Z87891 Personal history of nicotine dependence: Secondary | ICD-10-CM | POA: Diagnosis not present

## 2019-12-23 DIAGNOSIS — Z8249 Family history of ischemic heart disease and other diseases of the circulatory system: Secondary | ICD-10-CM | POA: Diagnosis not present

## 2019-12-23 DIAGNOSIS — J9601 Acute respiratory failure with hypoxia: Secondary | ICD-10-CM | POA: Diagnosis not present

## 2019-12-23 DIAGNOSIS — U071 COVID-19: Secondary | ICD-10-CM | POA: Diagnosis not present

## 2019-12-23 DIAGNOSIS — M1990 Unspecified osteoarthritis, unspecified site: Secondary | ICD-10-CM | POA: Diagnosis not present

## 2019-12-23 DIAGNOSIS — N390 Urinary tract infection, site not specified: Secondary | ICD-10-CM | POA: Diagnosis not present

## 2019-12-23 DIAGNOSIS — I739 Peripheral vascular disease, unspecified: Secondary | ICD-10-CM | POA: Diagnosis not present

## 2019-12-25 DIAGNOSIS — N390 Urinary tract infection, site not specified: Secondary | ICD-10-CM | POA: Diagnosis not present

## 2019-12-25 DIAGNOSIS — I10 Essential (primary) hypertension: Secondary | ICD-10-CM | POA: Diagnosis not present

## 2019-12-25 DIAGNOSIS — K219 Gastro-esophageal reflux disease without esophagitis: Secondary | ICD-10-CM | POA: Diagnosis not present

## 2019-12-25 DIAGNOSIS — E039 Hypothyroidism, unspecified: Secondary | ICD-10-CM | POA: Diagnosis not present

## 2019-12-30 DIAGNOSIS — M1990 Unspecified osteoarthritis, unspecified site: Secondary | ICD-10-CM | POA: Diagnosis not present

## 2019-12-30 DIAGNOSIS — N390 Urinary tract infection, site not specified: Secondary | ICD-10-CM | POA: Diagnosis not present

## 2019-12-30 DIAGNOSIS — F0390 Unspecified dementia without behavioral disturbance: Secondary | ICD-10-CM | POA: Diagnosis not present

## 2019-12-30 DIAGNOSIS — I10 Essential (primary) hypertension: Secondary | ICD-10-CM | POA: Diagnosis not present

## 2019-12-31 ENCOUNTER — Inpatient Hospital Stay (HOSPITAL_COMMUNITY)
Admission: EM | Admit: 2019-12-31 | Discharge: 2020-01-02 | DRG: 689 | Disposition: A | Discharge status: Skilled Nursing Facility | Payer: Medicare Other | Source: Skilled Nursing Facility | Attending: Family Medicine | Admitting: Family Medicine

## 2019-12-31 ENCOUNTER — Emergency Department (HOSPITAL_COMMUNITY): Payer: Medicare Other

## 2019-12-31 ENCOUNTER — Encounter (HOSPITAL_COMMUNITY): Payer: Self-pay | Admitting: Emergency Medicine

## 2019-12-31 ENCOUNTER — Other Ambulatory Visit: Payer: Self-pay

## 2019-12-31 DIAGNOSIS — Z809 Family history of malignant neoplasm, unspecified: Secondary | ICD-10-CM | POA: Diagnosis not present

## 2019-12-31 DIAGNOSIS — I252 Old myocardial infarction: Secondary | ICD-10-CM

## 2019-12-31 DIAGNOSIS — F0390 Unspecified dementia without behavioral disturbance: Secondary | ICD-10-CM | POA: Diagnosis present

## 2019-12-31 DIAGNOSIS — N390 Urinary tract infection, site not specified: Secondary | ICD-10-CM

## 2019-12-31 DIAGNOSIS — E860 Dehydration: Secondary | ICD-10-CM | POA: Diagnosis present

## 2019-12-31 DIAGNOSIS — G9341 Metabolic encephalopathy: Secondary | ICD-10-CM | POA: Diagnosis present

## 2019-12-31 DIAGNOSIS — Z87891 Personal history of nicotine dependence: Secondary | ICD-10-CM | POA: Diagnosis not present

## 2019-12-31 DIAGNOSIS — N179 Acute kidney failure, unspecified: Secondary | ICD-10-CM | POA: Diagnosis present

## 2019-12-31 DIAGNOSIS — R4182 Altered mental status, unspecified: Secondary | ICD-10-CM

## 2019-12-31 DIAGNOSIS — I959 Hypotension, unspecified: Secondary | ICD-10-CM | POA: Diagnosis present

## 2019-12-31 DIAGNOSIS — I10 Essential (primary) hypertension: Secondary | ICD-10-CM

## 2019-12-31 DIAGNOSIS — Z7982 Long term (current) use of aspirin: Secondary | ICD-10-CM

## 2019-12-31 DIAGNOSIS — R52 Pain, unspecified: Secondary | ICD-10-CM | POA: Diagnosis not present

## 2019-12-31 DIAGNOSIS — R404 Transient alteration of awareness: Secondary | ICD-10-CM | POA: Diagnosis not present

## 2019-12-31 DIAGNOSIS — N3000 Acute cystitis without hematuria: Principal | ICD-10-CM | POA: Diagnosis present

## 2019-12-31 DIAGNOSIS — Z885 Allergy status to narcotic agent status: Secondary | ICD-10-CM | POA: Diagnosis not present

## 2019-12-31 DIAGNOSIS — N1831 Chronic kidney disease, stage 3a: Secondary | ICD-10-CM | POA: Diagnosis present

## 2019-12-31 DIAGNOSIS — Z66 Do not resuscitate: Secondary | ICD-10-CM | POA: Diagnosis present

## 2019-12-31 DIAGNOSIS — E782 Mixed hyperlipidemia: Secondary | ICD-10-CM | POA: Diagnosis present

## 2019-12-31 DIAGNOSIS — Z8249 Family history of ischemic heart disease and other diseases of the circulatory system: Secondary | ICD-10-CM

## 2019-12-31 DIAGNOSIS — J9601 Acute respiratory failure with hypoxia: Secondary | ICD-10-CM | POA: Diagnosis not present

## 2019-12-31 DIAGNOSIS — U071 COVID-19: Secondary | ICD-10-CM | POA: Diagnosis present

## 2019-12-31 DIAGNOSIS — G9340 Encephalopathy, unspecified: Secondary | ICD-10-CM | POA: Insufficient documentation

## 2019-12-31 DIAGNOSIS — I129 Hypertensive chronic kidney disease with stage 1 through stage 4 chronic kidney disease, or unspecified chronic kidney disease: Secondary | ICD-10-CM | POA: Diagnosis present

## 2019-12-31 DIAGNOSIS — E785 Hyperlipidemia, unspecified: Secondary | ICD-10-CM | POA: Diagnosis not present

## 2019-12-31 DIAGNOSIS — J9811 Atelectasis: Secondary | ICD-10-CM | POA: Diagnosis not present

## 2019-12-31 LAB — PROTIME-INR
INR: 1.2 (ref 0.8–1.2)
Prothrombin Time: 14.7 seconds (ref 11.4–15.2)

## 2019-12-31 LAB — LACTIC ACID, PLASMA: Lactic Acid, Venous: 1.4 mmol/L (ref 0.5–1.9)

## 2019-12-31 LAB — CBC WITH DIFFERENTIAL/PLATELET
Abs Immature Granulocytes: 0.01 10*3/uL (ref 0.00–0.07)
Basophils Absolute: 0 10*3/uL (ref 0.0–0.1)
Basophils Relative: 0 %
Eosinophils Absolute: 0.1 10*3/uL (ref 0.0–0.5)
Eosinophils Relative: 2 %
HCT: 36.9 % (ref 36.0–46.0)
Hemoglobin: 11.3 g/dL — ABNORMAL LOW (ref 12.0–15.0)
Immature Granulocytes: 0 %
Lymphocytes Relative: 19 %
Lymphs Abs: 0.9 10*3/uL (ref 0.7–4.0)
MCH: 30.8 pg (ref 26.0–34.0)
MCHC: 30.6 g/dL (ref 30.0–36.0)
MCV: 100.5 fL — ABNORMAL HIGH (ref 80.0–100.0)
Monocytes Absolute: 0.4 10*3/uL (ref 0.1–1.0)
Monocytes Relative: 8 %
Neutro Abs: 3.3 10*3/uL (ref 1.7–7.7)
Neutrophils Relative %: 71 %
Platelets: 159 10*3/uL (ref 150–400)
RBC: 3.67 MIL/uL — ABNORMAL LOW (ref 3.87–5.11)
RDW: 13.9 % (ref 11.5–15.5)
WBC: 4.6 10*3/uL (ref 4.0–10.5)
nRBC: 0 % (ref 0.0–0.2)

## 2019-12-31 LAB — URINALYSIS, ROUTINE W REFLEX MICROSCOPIC
Bilirubin Urine: NEGATIVE
Glucose, UA: NEGATIVE mg/dL
Hgb urine dipstick: NEGATIVE
Ketones, ur: NEGATIVE mg/dL
Nitrite: NEGATIVE
Protein, ur: 30 mg/dL — AB
Specific Gravity, Urine: 1.017 (ref 1.005–1.030)
WBC, UA: >50 WBC/hpf — ABNORMAL HIGH (ref 0–5)
pH: 6 (ref 5.0–8.0)

## 2019-12-31 LAB — FERRITIN: Ferritin: 74 ng/mL (ref 11–307)

## 2019-12-31 LAB — POC SARS CORONAVIRUS 2 AG -  ED
SARS Coronavirus 2 Ag: NEGATIVE
SARS Coronavirus 2 Ag: POSITIVE — AB

## 2019-12-31 LAB — COMPREHENSIVE METABOLIC PANEL
ALT: 13 U/L (ref 0–44)
AST: 15 U/L (ref 15–41)
Albumin: 2.6 g/dL — ABNORMAL LOW (ref 3.5–5.0)
Alkaline Phosphatase: 57 U/L (ref 38–126)
Anion gap: 10 (ref 5–15)
BUN: 29 mg/dL — ABNORMAL HIGH (ref 8–23)
CO2: 24 mmol/L (ref 22–32)
Calcium: 8.1 mg/dL — ABNORMAL LOW (ref 8.9–10.3)
Chloride: 109 mmol/L (ref 98–111)
Creatinine, Ser: 1.33 mg/dL — ABNORMAL HIGH (ref 0.44–1.00)
GFR calc Af Amer: 40 mL/min — ABNORMAL LOW (ref >60)
GFR calc non Af Amer: 35 mL/min — ABNORMAL LOW (ref >60)
Glucose, Bld: 92 mg/dL (ref 70–99)
Potassium: 3.8 mmol/L (ref 3.5–5.1)
Sodium: 143 mmol/L (ref 135–145)
Total Bilirubin: 0.5 mg/dL (ref 0.3–1.2)
Total Protein: 5.6 g/dL — ABNORMAL LOW (ref 6.5–8.1)

## 2019-12-31 LAB — RESPIRATORY PANEL BY RT PCR (FLU A&B, COVID)
Influenza A by PCR: NEGATIVE
Influenza B by PCR: NEGATIVE
SARS Coronavirus 2 by RT PCR: POSITIVE — AB

## 2019-12-31 LAB — D-DIMER, QUANTITATIVE: D-Dimer, Quant: 6.56 ug/mL-FEU — ABNORMAL HIGH (ref 0.00–0.50)

## 2019-12-31 LAB — FIBRINOGEN: Fibrinogen: 325 mg/dL (ref 210–475)

## 2019-12-31 LAB — MAGNESIUM: Magnesium: 1.7 mg/dL (ref 1.7–2.4)

## 2019-12-31 LAB — CBG MONITORING, ED: Glucose-Capillary: 89 mg/dL (ref 70–99)

## 2019-12-31 LAB — APTT: aPTT: 27 seconds (ref 24–36)

## 2019-12-31 LAB — C-REACTIVE PROTEIN: CRP: 1 mg/dL — ABNORMAL HIGH (ref <1.0)

## 2019-12-31 MED ORDER — ACETAMINOPHEN 650 MG RE SUPP: 650.0000 mg | Freq: Four times a day (QID) | RECTAL | Status: DC | PRN

## 2019-12-31 MED ORDER — SODIUM CHLORIDE 0.9 % IV SOLN
1.0000 g | Freq: Once | INTRAVENOUS | Status: AC
  Administered 2019-12-31: 1 g via INTRAVENOUS
  Filled 2019-12-31: qty 10

## 2019-12-31 MED ORDER — SODIUM CHLORIDE 0.9 % IV BOLUS
1000.0000 mL | Freq: Once | INTRAVENOUS | Status: AC
  Administered 2019-12-31: 14:00:00 1000 mL via INTRAVENOUS

## 2019-12-31 MED ORDER — ACETAMINOPHEN 325 MG PO TABS: 650.0000 mg | ORAL_TABLET | Freq: Four times a day (QID) | ORAL | Status: DC | PRN

## 2019-12-31 MED ORDER — ALBUTEROL SULFATE HFA 108 (90 BASE) MCG/ACT IN AERS: 1.0000 | INHALATION_SPRAY | RESPIRATORY_TRACT | Status: DC | PRN

## 2019-12-31 MED ORDER — HEPARIN SODIUM (PORCINE) 5000 UNIT/ML IJ SOLN
5000.0000 [IU] | Freq: Three times a day (TID) | INTRAMUSCULAR | Status: DC
  Administered 2019-12-31 – 2020-01-02 (×6): 5000 [IU] via SUBCUTANEOUS
  Filled 2019-12-31 (×6): qty 1

## 2019-12-31 MED ORDER — PANTOPRAZOLE SODIUM 40 MG IV SOLR
40.0000 mg | INTRAVENOUS | Status: DC
  Administered 2020-01-01 – 2020-01-02 (×2): 40 mg via INTRAVENOUS
  Filled 2019-12-31 (×2): qty 40

## 2019-12-31 MED ORDER — SODIUM CHLORIDE 0.9 % IV SOLN
INTRAVENOUS | Status: DC | PRN
  Administered 2019-12-31: 500 mL via INTRAVENOUS

## 2019-12-31 MED ORDER — SODIUM CHLORIDE 0.9 % IV SOLN
1.0000 g | INTRAVENOUS | Status: DC
  Administered 2020-01-01 – 2020-01-02 (×2): 1 g via INTRAVENOUS
  Filled 2019-12-31 (×2): qty 10

## 2019-12-31 MED ORDER — SODIUM CHLORIDE 0.9 % IV SOLN: INTRAVENOUS | Status: DC

## 2019-12-31 MED ORDER — SODIUM CHLORIDE 0.9% FLUSH
3.0000 mL | Freq: Two times a day (BID) | INTRAVENOUS | Status: DC
  Administered 2020-01-01: 3 mL via INTRAVENOUS

## 2019-12-31 NOTE — ED Notes (Signed)
Michelle Sharp (contact person) (c) [phone], (H) [phone]

## 2019-12-31 NOTE — ED Notes (Signed)
ED TO INPATIENT HANDOFF REPORT  ED Nurse Name and Phone #: [phone]  S Name/Age/Gender Michelle Sharp 86 y.o. female Room/Bed: APA01/APA01  Code Status   Code Status: DNR  Home/SNF/Other Nursing Home Patient oriented AJ:OINO  Is this baseline? Yes   Triage Complete: Triage complete  Chief Complaint Acute encephalopathy [G93.40]  Triage Note Pt from [facility]. According to staff, pt was found unresponsive with minimal response to painful stimulus.   Pt responsive to pain during triage, confused x 4.     Allergies Allergies  Allergen Reactions  . Codeine Rash    Level of Care/Admitting Diagnosis ED Disposition    ED Disposition Condition Comment   Admit  Hospital Area: [facility] [[id number]]  Level of Care: Med-Surg [16]  Covid Evaluation: Confirmed COVID Positive  Diagnosis: Acute encephalopathy [676720]  Admitting Physician: Angie Fava [[id number]]  Attending Physician: Angie Fava [[id number]]  Estimated length of stay: past midnight tomorrow  Certification:: I certify this patient will need inpatient services for at least 2 midnights       B Medical/Surgery History Past Medical History:  Diagnosis Date  . Hypertension    Past Surgical History:  Procedure Laterality Date  . CHOLECYSTECTOMY    . MASTECTOMY       A IV Location/Drains/Wounds Patient Lines/Drains/Airways Status   Active Line/Drains/Airways    Name:   Placement date:   Placement time:   Site:   Days:   Peripheral IV 03/01/17 Left;Posterior Forearm   03/01/17    1410    Forearm   1035   Peripheral IV 12/31/19 Right;Posterior Forearm   12/31/19    1911    Forearm   less than 1          Intake/Output Last 24 hours No intake or output data in the 24 hours ending 12/31/19 2201  Labs/Imaging Results for orders placed or performed during the hospital encounter of 12/31/19 (from the past 48 hour(s))  POC CBG, ED     Status: None   Collection Time:  12/31/19  2:11 PM  Result Value Ref Range   Glucose-Capillary 89 70 - 99 mg/dL  POC SARS Coronavirus 2 Ag-ED -     Status: None   Collection Time: 12/31/19  2:17 PM  Result Value Ref Range   SARS Coronavirus 2 Ag NEGATIVE NEGATIVE    Comment: (NOTE) SARS-CoV-2 antigen NOT DETECTED.  Negative results are presumptive.  Negative results do not preclude SARS-CoV-2 infection and should not be used as the sole basis for treatment or other patient management decisions, including infection  control decisions, particularly in the presence of clinical signs and  symptoms consistent with COVID-19, or in those who have been in contact with the virus.  Negative results must be combined with clinical observations, patient history, and epidemiological information. The expected result is Negative. Fact Sheet for Patients: https://sanders-williams.net/ Fact Sheet for Healthcare Providers: https://martinez.com/ This test is not yet approved or cleared by the [place] FDA and  has been authorized for detection and/or diagnosis of SARS-CoV-2 by FDA under an Emergency Use Authorization (EUA).  This EUA will remain in effect (meaning this test can be used) for the duration of  the COVID-19 de claration under Section 564(b)(1) of the Act, 21 U.S.C. section 360bbb-3(b)(1), unless the authorization is terminated or revoked sooner.   Urinalysis, Routine w reflex microscopic     Status: Abnormal   Collection Time: 12/31/19  2:27 PM  Result Value Ref Range  Color, Urine YELLOW YELLOW   APPearance CLEAR CLEAR   Specific Gravity, Urine 1.017 1.005 - 1.030   pH 6.0 5.0 - 8.0   Glucose, UA NEGATIVE NEGATIVE mg/dL   Hgb urine dipstick NEGATIVE NEGATIVE   Bilirubin Urine NEGATIVE NEGATIVE   Ketones, ur NEGATIVE NEGATIVE mg/dL   Protein, ur 30 (A) NEGATIVE mg/dL   Nitrite NEGATIVE NEGATIVE   Leukocytes,Ua LARGE (A) NEGATIVE   RBC / HPF 11-20 0 - 5 RBC/hpf   WBC, UA >50  (H) 0 - 5 WBC/hpf   Bacteria, UA RARE (A) NONE SEEN   Squamous Epithelial / LPF 0-5 0 - 5   Non Squamous Epithelial 0-5 (A) NONE SEEN    Comment: Performed at [facility], [address], [place], [place] [address]  Respiratory Panel by RT PCR (Flu A&B, Covid) - Nasopharyngeal Swab     Status: Abnormal   Collection Time: 12/31/19  2:55 PM   Specimen: Nasopharyngeal Swab  Result Value Ref Range   SARS Coronavirus 2 by RT PCR POSITIVE (A) NEGATIVE    Comment: RESULT CALLED TO, READ BACK BY AND VERIFIED WITH: HOLCOMB,R ON 12/31/19 AT 1645 BY LOY,C (NOTE) SARS-CoV-2 target nucleic acids are DETECTED. SARS-CoV-2 RNA is generally detectable in upper respiratory specimens  during the acute phase of infection. Positive results are indicative of the presence of the identified virus, but do not rule out bacterial infection or co-infection with other pathogens not detected by the test. Clinical correlation with patient history and other diagnostic information is necessary to determine patient infection status. The expected result is Negative. Fact Sheet for Patients:  PinkCheek.be Fact Sheet for Healthcare Providers: GravelBags.it This test is not yet approved or cleared by the [place] FDA and  has been authorized for detection and/or diagnosis of SARS-CoV-2 by FDA under an Emergency Use Authorization (EUA).  This EUA will remain in effect (meaning this test can be used)  for the duration of  the COVID-19 declaration under Section 564(b)(1) of the Act, 21 U.S.C. section 360bbb-3(b)(1), unless the authorization is terminated or revoked sooner.    Influenza A by PCR NEGATIVE NEGATIVE   Influenza B by PCR NEGATIVE NEGATIVE    Comment: (NOTE) The Xpert Xpress SARS-CoV-2/FLU/RSV assay is intended as an aid in  the diagnosis of influenza from Nasopharyngeal swab specimens and  should not be used as a sole basis for treatment. Nasal  washings and  aspirates are unacceptable for Xpert Xpress SARS-CoV-2/FLU/RSV  testing. Fact Sheet for Patients: PinkCheek.be Fact Sheet for Healthcare Providers: GravelBags.it This test is not yet approved or cleared by the [place] FDA and  has been authorized for detection and/or diagnosis of SARS-CoV-2 by  FDA under an Emergency Use Authorization (EUA). This EUA will remain  in effect (meaning this test can be used) for the duration of the  Covid-19 declaration under Section 564(b)(1) of the Act, 21  U.S.C. section 360bbb-3(b)(1), unless the authorization is  terminated or revoked. Performed at [facility], [address], [place], [place] [address]   POC SARS Coronavirus 2 Ag-ED -     Status: Abnormal   Collection Time: 12/31/19  3:00 PM  Result Value Ref Range   SARS Coronavirus 2 Ag POSITIVE (A) NEGATIVE    Comment: PATIENT IDENTIFICATION ERROR. PLEASE DISREGARD RESULTS. ACCOUNT WILL BE CREDITED. (NOTE) SARS-CoV-2 antigen PRESENT. Positive results indicate the presence of viral antigens, but clinical correlation with patient history and other diagnostic information is necessary to determine patient infection status.  Positive results do not rule out bacterial infection or co-infection  with other viruses. False positive results are rare but can occur, and confirmatory RT-PCR testing may be appropriate in some circumstances. The expected result is Negative. Fact Sheet for Patients: https://sanders-williams.net/ Fact Sheet for Providers: https://martinez.com/  This test is not yet approved or cleared by the [place] FDA and  has been authorized for detection and/or diagnosis of SARS-CoV-2 by FDA under an Emergency Use Authorization (EUA).  This EUA will remain in effect (meaning this test can be used) for the duration of  the COVID-19 declaration  under Section 564(b)(1) of the  Act, 21 U.S.C. section 360bbb-3(b)(1), unless the authorization is terminated or revoked sooner. Performed at [facility], [address] [place], [place] [address]   Lactic acid, plasma     Status: None   Collection Time: 12/31/19  3:28 PM  Result Value Ref Range   Lactic Acid, Venous 1.4 0.5 - 1.9 mmol/L    Comment: Performed at [facility], [address], [place], [place] [address]  Comprehensive metabolic panel     Status: Abnormal   Collection Time: 12/31/19  3:28 PM  Result Value Ref Range   Sodium 143 135 - 145 mmol/L   Potassium 3.8 3.5 - 5.1 mmol/L   Chloride 109 98 - 111 mmol/L   CO2 24 22 - 32 mmol/L   Glucose, Bld 92 70 - 99 mg/dL   BUN 29 (H) 8 - 23 mg/dL   Creatinine, Ser 6.94 (H) 0.44 - 1.00 mg/dL   Calcium 8.1 (L) 8.9 - 10.3 mg/dL   Total Protein 5.6 (L) 6.5 - 8.1 g/dL   Albumin 2.6 (L) 3.5 - 5.0 g/dL   AST 15 15 - 41 U/L   ALT 13 0 - 44 U/L   Alkaline Phosphatase 57 38 - 126 U/L   Total Bilirubin 0.5 0.3 - 1.2 mg/dL   GFR calc non Af Amer 35 (L) >60 mL/min   GFR calc Af Amer 40 (L) >60 mL/min   Anion gap 10 5 - 15    Comment: Performed at [facility], [address] [place], [place] [address]  CBC WITH DIFFERENTIAL     Status: Abnormal   Collection Time: 12/31/19  3:28 PM  Result Value Ref Range   WBC 4.6 4.0 - 10.5 K/uL   RBC 3.67 (L) 3.87 - 5.11 MIL/uL   Hemoglobin 11.3 (L) 12.0 - 15.0 g/dL   HCT 70.3 50.0 - 93.8 %   MCV 100.5 (H) 80.0 - 100.0 fL   MCH 30.8 26.0 - 34.0 pg   MCHC 30.6 30.0 - 36.0 g/dL   RDW 18.2 99.3 - 71.6 %   Platelets 159 150 - 400 K/uL   nRBC 0.0 0.0 - 0.2 %   Neutrophils Relative % 71 %   Neutro Abs 3.3 1.7 - 7.7 K/uL   Lymphocytes Relative 19 %   Lymphs Abs 0.9 0.7 - 4.0 K/uL   Monocytes Relative 8 %   Monocytes Absolute 0.4 0.1 - 1.0 K/uL   Eosinophils Relative 2 %   Eosinophils Absolute 0.1 0.0 - 0.5 K/uL   Basophils Relative 0 %   Basophils Absolute 0.0 0.0 - 0.1 K/uL   Immature Granulocytes 0 %   Abs Immature  Granulocytes 0.01 0.00 - 0.07 K/uL    Comment: Performed at [facility], [address], [place], [place] [address]  APTT     Status: None   Collection Time: 12/31/19  3:28 PM  Result Value Ref Range   aPTT 27 24 - 36 seconds    Comment: Performed at [facility], [address], [place], [place] [address]  Protime-INR     Status: None   Collection Time: 12/31/19  3:28 PM  Result Value Ref Range   Prothrombin Time 14.7 11.4 - 15.2 seconds   INR 1.2 0.8 - 1.2    Comment: (NOTE) INR goal varies based on device and disease states. Performed at [facility], [address], [place], [place] [address]   Magnesium     Status: None   Collection Time: 12/31/19  3:28 PM  Result Value Ref Range   Magnesium 1.7 1.7 - 2.4 mg/dL    Comment: Performed at [facility], [address] [place], [place] [address]  Ferritin     Status: None   Collection Time: 12/31/19  3:28 PM  Result Value Ref Range   Ferritin 74 11 - 307 ng/mL    Comment: Performed at [facility], [address], [place], [place] [address]  Fibrinogen     Status: None   Collection Time: 12/31/19  3:28 PM  Result Value Ref Range   Fibrinogen 325 210 - 475 mg/dL    Comment: Performed at [facility], [address] [place], [place] [address]  D-dimer, quantitative (not at [facility])     Status: Abnormal   Collection Time: 12/31/19  3:28 PM  Result Value Ref Range   D-Dimer, Quant 6.56 (H) 0.00 - 0.50 ug/mL-FEU    Comment: (NOTE) At the manufacturer cut-off of 0.50 ug/mL FEU, this assay has been documented to exclude PE with a sensitivity and negative predictive value of 97 to 99%.  At this time, this assay has not been approved by the FDA to exclude DVT/VTE. Results should be correlated with clinical presentation. Performed at [facility], [address] [place], [place] [address]   C-reactive protein     Status: Abnormal   Collection Time: 12/31/19  3:28 PM  Result Value Ref Range   CRP 1.0 (H) <1.0 mg/dL    Comment: Performed  at [facility], [address], [place], [place] [address]  Blood Culture (routine x 2)     Status: None (Preliminary result)   Collection Time: 12/31/19  3:29 PM   Specimen: Blood  Result Value Ref Range   Specimen Description RIGHT ANTECUBITAL    Special Requests      BOTTLES DRAWN AEROBIC AND ANAEROBIC Blood Culture adequate volume Performed at [facility], [address], [place], [place] [address]    Culture PENDING    Report Status PENDING   Blood Culture (routine x 2)     Status: None (Preliminary result)   Collection Time: 12/31/19  3:29 PM   Specimen: Blood  Result Value Ref Range   Specimen Description BLOOD RIGHT HAND    Special Requests      BOTTLES DRAWN AEROBIC ONLY Blood Culture adequate volume Performed at [facility], [address] [place], [place] [address]    Culture PENDING    Report Status PENDING    CT Head Wo Contrast  Result Date: 12/31/2019 CLINICAL DATA:  Altered mental status. EXAM: CT HEAD WITHOUT CONTRAST TECHNIQUE: Contiguous axial images were obtained from the base of the skull through the vertex without intravenous contrast. COMPARISON:  September 29, 2019. FINDINGS: Brain: Mild diffuse cortical atrophy is noted. Mild chronic ischemic white matter disease is noted. No mass effect or midline shift is noted. Ventricular size is within normal  limits. There is no evidence of mass lesion, hemorrhage or acute infarction. Vascular: No hyperdense vessel or unexpected calcification. Skull: Normal. Negative for fracture or focal lesion. Sinuses/Orbits: No acute finding. Other: None. IMPRESSION: Mild diffuse cortical atrophy. Mild chronic ischemic white matter disease. No acute intracranial abnormality seen. Electronically Signed   By: Lupita RaiderJames  Green Jr M.D.   On: 12/31/2019 15:10   DG Chest Port 1 View  Result Date: 12/31/2019 CLINICAL DATA:  Altered mental status. Unresponsive. EXAM: PORTABLE CHEST 1 VIEW COMPARISON:  12/06/2019 FINDINGS: Numerous leads and wires  project over the chest. Surgical clips about the right chest wall and right axilla. Mild cardiomegaly. Atherosclerosis in the transverse aorta. No pleural effusion or pneumothorax. Nonspecific interstitial thickening. Subsegmental atelectasis at both lung bases. No lobar consolidation. Osteopenia. IMPRESSION: 1. No acute cardiopulmonary disease. 2. Cardiomegaly with mild nonspecific interstitial thickening. Electronically Signed   By: Jeronimo GreavesKyle  Talbot M.D.   On: 12/31/2019 14:37    Pending Labs Unresulted Labs (From admission, onward)    Start     Ordered   01/01/20 0500  Magnesium  Tomorrow morning,   R     12/31/19 1816   01/01/20 0500  Phosphorus  Tomorrow morning,   R     12/31/19 1816   01/01/20 0500  Comprehensive metabolic panel  Tomorrow morning,   R     12/31/19 1816   01/01/20 0500  CBC WITH DIFFERENTIAL  Tomorrow morning,   R     12/31/19 1816   01/01/20 0500  TSH  Tomorrow morning,   R     12/31/19 1816   01/01/20 0500  Ferritin  Tomorrow morning,   R     12/31/19 1819   01/01/20 0500  Fibrinogen  Tomorrow morning,   R     12/31/19 1819   01/01/20 0500  C-reactive protein  Tomorrow morning,   R     12/31/19 1819   01/01/20 0500  D-dimer, quantitative (not at [facility])  Tomorrow morning,   R     12/31/19 1819   01/01/20 0500  Blood gas, arterial  Tomorrow morning,   R     12/31/19 1819   12/31/19 1818  Creatinine, urine, random  Add-on,   AD     12/31/19 1817   12/31/19 1818  Sodium, urine, random  Add-on,   AD     12/31/19 1817   12/31/19 1412  Urine culture  ONCE - STAT,   STAT     12/31/19 1412          Vitals/Pain Today's Vitals   12/31/19 1600 12/31/19 1916 12/31/19 2030 12/31/19 2100  BP: 117/66 126/86 (!) 102/44 (!) 108/42  Pulse:  81 79 81  Resp: 20 18 18 20   Temp:      TempSrc:      SpO2:  98% 95% 94%    Isolation Precautions No active isolations  Medications Medications  0.9 %  sodium chloride infusion (500 mLs Intravenous New Bag/Given 12/31/19 1635)   sodium chloride flush (NS) 0.9 % injection 3 mL (has no administration in time range)  acetaminophen (TYLENOL) tablet 650 mg (has no administration in time range)    Or  acetaminophen (TYLENOL) suppository 650 mg (has no administration in time range)  heparin injection 5,000 Units (5,000 Units Subcutaneous Given 12/31/19 2148)  0.9 %  sodium chloride infusion ( Intravenous New Bag/Given 12/31/19 1916)  albuterol (VENTOLIN HFA) 108 (90 Base) MCG/ACT inhaler 1-2 puff (has no administration in time  range)  cefTRIAXone (ROCEPHIN) 1 g in sodium chloride 0.9 % 100 mL IVPB (has no administration in time range)  pantoprazole (PROTONIX) injection 40 mg (has no administration in time range)  sodium chloride 0.9 % bolus 1,000 mL (0 mLs Intravenous Stopped 12/31/19 1912)  cefTRIAXone (ROCEPHIN) 1 g in sodium chloride 0.9 % 100 mL IVPB (0 g Intravenous Stopped 12/31/19 1706)    Mobility walks with device High fall risk   Focused Assessments    R Recommendations: See Admitting Provider Note  Report given to:   Additional Notes: dementia

## 2019-12-31 NOTE — ED Provider Notes (Signed)
[facility] EMERGENCY DEPARTMENT Provider Note   CSN: [id number] Arrival date & time: 12/31/19  1348     History Chief Complaint  Patient presents with  . Altered Mental Status    Michelle Sharp is a 86 y.o. female who presents to the emergency department with altered mental status.  She comes via EMS from [facility] and [facility].  Per EMS, patient's roommate tested positive for Covid yesterday and staff felt she was altered this morning.  They report patient had a negative Covid test this morning.  She is a DNR. We have seen several COVID + people from this facility in the last 24 hours.  Level 5 caveat for altered mental status.    The history is provided by medical records and the EMS personnel. The history is limited by the condition of the patient. No language interpreter was used.       Past Medical History:  Diagnosis Date  . Hypertension     Patient Active Problem List   Diagnosis Date Noted  . Dementia (HCC) 02/27/2017  . NSTEMI (non-ST elevated myocardial infarction) (HCC) 02/26/2017  . HYPERLIPIDEMIA-MIXED 09/16/2009  . HYPERKALEMIA 09/16/2009  . HYPERTENSION, UNSPECIFIED 09/16/2009  . PALPITATIONS 09/16/2009    Past Surgical History:  Procedure Laterality Date  . CHOLECYSTECTOMY    . MASTECTOMY       OB History   No obstetric history on file.     Family History  Problem Relation Age of Onset  . High blood pressure Mother   . Cancer Father   . Cancer Sister   . Heart disease Brother   . Heart attack Brother     Social History   Tobacco Use  . Smoking status: Former Smoker    Quit date: 03/01/1987    Years since quitting: 32.8  . Smokeless tobacco: Never Used  Substance Use Topics  . Alcohol use: No  . Drug use: No    Home Medications Prior to Admission medications   Medication Sig Start Date End Date Taking? Authorizing Provider  amLODipine (NORVASC) 5 MG tablet Take 1 tablet by mouth daily. 05/20/19    [provider]  aspirin EC 81 MG tablet Take 1 tablet (81 mg total) by mouth daily. 07/04/19   Laqueta Linden, MD  atorvastatin (LIPITOR) 10 MG tablet Take 1 tablet by mouth daily. 05/20/19   [provider]  azelastine (ASTELIN) 0.1 % nasal spray Place 2 sprays into both nostrils daily. 01/02/19   [provider]  diclofenac Sodium (VOLTAREN) 1 % GEL  11/29/19   [provider]  donepezil (ARICEPT) 5 MG tablet Take 5 mg by mouth daily.    [provider]  LORazepam (ATIVAN) 0.5 MG tablet Take 1 tablet by mouth daily. 06/11/19   [provider]  metoprolol tartrate (LOPRESSOR) 100 MG tablet  12/09/19   [provider]  metoprolol tartrate (LOPRESSOR) 25 MG tablet TAKE (1/2) TABLET BY MOUTH TWICE DAILY. 01/25/18   Laqueta Linden, MD  nitroGLYCERIN (NITROSTAT) 0.4 MG SL tablet Place 1 tablet (0.4 mg total) under the tongue every 5 (five) minutes x 3 doses as needed for chest pain. 03/02/17   Barrett, Joline Salt, PA-C  omeprazole (PRILOSEC) 20 MG capsule Take 20 mg by mouth daily. 12/18/19   [provider]  pantoprazole (PROTONIX) 20 MG tablet TAKE (1) TABLET BY MOUTH ONCE DAILY. 09/26/19   Laqueta Linden, MD  potassium chloride (KLOR-CON) 10 MEQ tablet Take 10  mEq by mouth daily. 12/18/19   [provider]  valsartan (DIOVAN) 320 MG tablet Take 320 mg by mouth daily. 12/18/19   [provider]    Allergies    Codeine  Review of Systems   Review of Systems  Unable to perform ROS: Mental status change    Physical Exam Updated Vital Signs BP (!) 97/52 (BP Location: Right Arm)   Pulse 85   Temp 99.3 F (37.4 C) (Rectal)   Resp (!) 23   SpO2 98%   Physical Exam Vitals and nursing note reviewed. Exam conducted with a chaperone present.  Constitutional:      General: She is not in acute distress.    Appearance: She is not diaphoretic.     Comments: Patient groans with stimulation, answers no  questions Chronically ill-appearing  HENT:     Head: Normocephalic.     Mouth/Throat:     Mouth: Mucous membranes are dry.  Eyes:     General: Lids are normal. No scleral icterus.    Conjunctiva/sclera: Conjunctivae normal.     Pupils: Pupils are equal, round, and reactive to light.  Cardiovascular:     Rate and Rhythm: Normal rate and regular rhythm.     Pulses: Normal pulses.          Radial pulses are 2+ on the right side and 2+ on the left side.  Pulmonary:     Effort: No tachypnea, accessory muscle usage, prolonged expiration, respiratory distress or retractions.     Breath sounds: No stridor. No wheezing or rhonchi.     Comments: Equal chest rise. No increased work of breathing. Abdominal:     General: Abdomen is flat. There is no distension.     Palpations: Abdomen is soft.     Tenderness: There is no abdominal tenderness. There is no guarding or rebound.  Genitourinary:    Labia:        Right: No rash.        Left: No rash.   Musculoskeletal:     Cervical back: No rigidity.  Skin:    General: Skin is warm and dry.     Capillary Refill: Capillary refill takes less than 2 seconds.     Comments: No rash, visible open wounds or erythema  Neurological:     Mental Status: She is lethargic.     GCS: GCS eye subscore is 2. GCS verbal subscore is 2. GCS motor subscore is 4.     Comments: Patient groans with stimulation, withdraws from pain and opens eyes to pain - unknown mental baseline  Psychiatric:        Mood and Affect: Mood normal.     ED Results / Procedures / Treatments   Labs (all labs ordered are listed, but only abnormal results are displayed) Labs Reviewed  CBC WITH DIFFERENTIAL/PLATELET - Abnormal; Notable for the following components:      Result Value   RBC 3.67 (*)    Hemoglobin 11.3 (*)    MCV 100.5 (*)    All other components within normal limits  URINALYSIS, ROUTINE W REFLEX MICROSCOPIC - Abnormal; Notable for the following components:   Protein,  ur 30 (*)    Leukocytes,Ua LARGE (*)    WBC, UA >50 (*)    Bacteria, UA RARE (*)    Non Squamous Epithelial 0-5 (*)    All other components within normal limits  POC SARS CORONAVIRUS 2 AG -  ED - Abnormal; Notable for the following  components:   SARS Coronavirus 2 Ag POSITIVE (*)    All other components within normal limits  CULTURE, BLOOD (ROUTINE X 2)  CULTURE, BLOOD (ROUTINE X 2)  URINE CULTURE  RESPIRATORY PANEL BY RT PCR (FLU A&B, COVID)  LACTIC ACID, PLASMA  APTT  PROTIME-INR  LACTIC ACID, PLASMA  COMPREHENSIVE METABOLIC PANEL  CBG MONITORING, ED  POC SARS CORONAVIRUS 2 AG -  ED    EKG EKG Interpretation  Date/Time:  Tuesday December 31 2019 14:14:23 EST Ventricular Rate:  83 PR Interval:    QRS Duration: 87 QT Interval:  393 QTC Calculation: 462 R Axis:   -53 Text Interpretation: Sinus rhythm Inferior infarct, old Anterior infarct, old Baseline wander in lead(s) V6 No old tracing to compare Confirmed by Pricilla Loveless [phone]) on 12/31/2019 3:13:10 PM   Radiology CT Head Wo Contrast  Result Date: 12/31/2019 CLINICAL DATA:  Altered mental status. EXAM: CT HEAD WITHOUT CONTRAST TECHNIQUE: Contiguous axial images were obtained from the base of the skull through the vertex without intravenous contrast. COMPARISON:  September 29, 2019. FINDINGS: Brain: Mild diffuse cortical atrophy is noted. Mild chronic ischemic white matter disease is noted. No mass effect or midline shift is noted. Ventricular size is within normal limits. There is no evidence of mass lesion, hemorrhage or acute infarction. Vascular: No hyperdense vessel or unexpected calcification. Skull: Normal. Negative for fracture or focal lesion. Sinuses/Orbits: No acute finding. Other: None. IMPRESSION: Mild diffuse cortical atrophy. Mild chronic ischemic white matter disease. No acute intracranial abnormality seen. Electronically Signed   By: Lupita Raider M.D.   On: 12/31/2019 15:10   DG Chest Port 1 View  Result  Date: 12/31/2019 CLINICAL DATA:  Altered mental status. Unresponsive. EXAM: PORTABLE CHEST 1 VIEW COMPARISON:  12/06/2019 FINDINGS: Numerous leads and wires project over the chest. Surgical clips about the right chest wall and right axilla. Mild cardiomegaly. Atherosclerosis in the transverse aorta. No pleural effusion or pneumothorax. Nonspecific interstitial thickening. Subsegmental atelectasis at both lung bases. No lobar consolidation. Osteopenia. IMPRESSION: 1. No acute cardiopulmonary disease. 2. Cardiomegaly with mild nonspecific interstitial thickening. Electronically Signed   By: Jeronimo Greaves M.D.   On: 12/31/2019 14:37    Procedures Procedures (including critical care time)  Medications Ordered in ED Medications  cefTRIAXone (ROCEPHIN) 1 g in sodium chloride 0.9 % 100 mL IVPB (has no administration in time range)  sodium chloride 0.9 % bolus 1,000 mL (1,000 mLs Intravenous New Bag/Given 12/31/19 1415)    ED Course  I have reviewed the triage vital signs and the nursing notes.  Pertinent labs & imaging results that were available during my care of the patient were reviewed by me and considered in my medical decision making (see chart for details).  Clinical Course as of Dec 30 1625  Tue Dec 31, 2019  1410 Multiple attempts to contact the [facility] for further information about the patient however no one is answering the phone.   [HM]  1528 Leukocytes, white blood cells and rare bacteria on cath urine sample.  We will begin treating for urinary tract infection.  Leukocytes,Ua(!): LARGE [HM]  1625 Additional attempts at contacting pt's LTC facility have been unsuccessful.     [HM]    Clinical Course User Index [HM] Jamarrius Salay, Boyd Kerbs   MDM Rules/Calculators/A&P                      Patient presents with altered mental status.  Altered mental status work-up initiated.  Numerous attempts to contact facility without success.  EMS reports patient's roommate has  Covid.  The patient was discussed with and seen by Dr. Criss Alvine who agrees with the treatment plan.  Patient remains lethargic. Her vital signs have remained stable.  BP (!) 102/45   Pulse 84   Temp 99.3 F (37.4 C) (Rectal)   Resp 15   SpO2 96%   Additional labs are pending. CT scan without acute intracranial hemorrhage. Chest x-ray without consolidation or evidence of pneumonia. UA does show urinary tract infection. Rocephin given.  Initial Covid antigen test was negative. At 3 PM second Covid antigen test resulted positive however RN reports concerned that this result belongs to another patient. RVP for flu and Covid is pending. Patient will need admission.  At shift change care was transferred to Dr. Juleen China who will follow pending studies and admit.  Final Clinical Impression(s) / ED Diagnoses Final diagnoses:  Altered mental status, unspecified altered mental status type  Urinary tract infection without hematuria, site unspecified    Rx / DC Orders ED Discharge Orders    None       Rosamaria Donn, Boyd Kerbs 12/31/19 1627    Pricilla Loveless, MD 01/02/20 [phone]

## 2019-12-31 NOTE — ED Triage Notes (Signed)
Pt from [facility]. According to staff, pt was found unresponsive with minimal response to painful stimulus.   Pt responsive to pain during triage, confused x 4.

## 2019-12-31 NOTE — H&P (Addendum)
History and Physical    PLEASE NOTE THAT DRAGON DICTATION SOFTWARE WAS USED IN THE CONSTRUCTION OF THIS NOTE.   Michelle Sharp NKN:[id number] DOB: 1926-02-19 DOA: 12/31/2019  PCP: Glenda Chroman, MD Patient coming from: [facility] in [place].  I have personally briefly reviewed patient's old medical records in [place]  Chief Complaint: Altered mental status  HPI: Michelle Sharp is a 86 y.o. female with medical history significant for dementia, hypertension, hyperlipidemia who is admitted to [facility] on 12/31/2019 with acute metabolic encephalopathy in the setting of COVID-19 and urinary tract infection after presenting from [facility] to [facility] Emergency Department via EMS for evaluation of altered mental status.   In the setting of acute encephalopathy superimposed on dementia, the following history is obtained via my discussions with the emergency department physician as well as via chart review.   Per [facility] staff, the patient has exhibited 1 day of progressive confusion, somnolence, and diminished responsiveness, all of which reportedly represented apart from the patient since baseline mental status, although the specifics of her baseline mental status are not completely clear at this time.  Of note, the patient's roommate at [facility] was reportedly diagnosed with COVID-19 yesterday (12/30/19).  Per chart review, no documentation chronic conditions.  Additionally, it appears that the patient is a former smoker, completely quit smoking in the 1980s after an unclear duration of smoking.     ED Course:  Vital signs in the ED were notable for the following: Temperature max 99.3; heart rate 84-85; blood pressure 102/45 117/66; respiratory rate 15-20, and oxygen saturation 92 to 98% on room air.  Labs were notable for the following: CMP notable for sodium 143, bicarbonate 24, BUN 29, creatinine 1.33 relative to most recent prior creatinine  data point of 0.88 when checked in March 2018, BUN to creatinine ratio 21.8, albumin 2.6, but otherwise liver enzymes were found to be within normal limits.  CBC notable for white blood cell count of 4600 with 71% neutrophils.  Lactic acid 1.4.  Urinalysis notable for greater than 50 white blood cells, large leukocyte esterase, no squamous epithelial cells, and 30 proteinuria.  Nasopharyngeal COVID-19 PCR performed in the ED today found to be positive.  Blood cultures x2 were collected, while urine sample was sent for culture.  Chest x-ray, per final radiology report showed no evidence of acute cardiopulmonary process, including no evidence of infiltrate, edema, effusion, pneumothorax.  Noncontrast CT the head, per final radiology report showed mild diffuse cortical atrophy with mild chronic ischemic white matter disease in the absence of any acute intracranial process, including no evidence of intracranial hemorrhage or acute ischemic infarct.  While in the ED, the following were administered: In the setting of suspected urinary tract infection, the patient received Rocephin 1 g IV x1.  Additionally she received a 1 L IV normal saline bolus.    Review of Systems: As per HPI otherwise 10 point review of systems negative.   Past Medical History:  Diagnosis Date   Hypertension     Past Surgical History:  Procedure Laterality Date   CHOLECYSTECTOMY     MASTECTOMY      Social History:  reports that she quit smoking about 32 years ago. She has never used smokeless tobacco. She reports that she does not drink alcohol or use drugs.   Allergies  Allergen Reactions   Codeine Rash    Family History  Problem Relation Age of  Onset   High blood pressure Mother    Cancer Father    Cancer Sister    Heart disease Brother    Heart attack Brother      Prior to Admission medications   Medication Sig Start Date End Date Taking? Authorizing Provider  amLODipine (NORVASC) 5 MG tablet  Take 1 tablet by mouth daily. 05/20/19   [provider]  aspirin EC 81 MG tablet Take 1 tablet (81 mg total) by mouth daily. 07/04/19   Herminio Commons, MD  atorvastatin (LIPITOR) 10 MG tablet Take 1 tablet by mouth daily. 05/20/19   [provider]  azelastine (ASTELIN) 0.1 % nasal spray Place 2 sprays into both nostrils daily. 01/02/19   [provider]  diclofenac Sodium (VOLTAREN) 1 % GEL  11/29/19   [provider]  donepezil (ARICEPT) 5 MG tablet Take 5 mg by mouth daily.    [provider]  LORazepam (ATIVAN) 0.5 MG tablet Take 1 tablet by mouth daily. 06/11/19   [provider]  metoprolol tartrate (LOPRESSOR) 100 MG tablet  12/09/19   [provider]  metoprolol tartrate (LOPRESSOR) 25 MG tablet TAKE (1/2) TABLET BY MOUTH TWICE DAILY. 01/25/18   Herminio Commons, MD  nitroGLYCERIN (NITROSTAT) 0.4 MG SL tablet Place 1 tablet (0.4 mg total) under the tongue every 5 (five) minutes x 3 doses as needed for chest pain. 03/02/17   Barrett, Evelene Croon, PA-C  omeprazole (PRILOSEC) 20 MG capsule Take 20 mg by mouth daily. 12/18/19   [provider]  pantoprazole (PROTONIX) 20 MG tablet TAKE (1) TABLET BY MOUTH ONCE DAILY. 09/26/19   Herminio Commons, MD  potassium chloride (KLOR-CON) 10 MEQ tablet Take 10 mEq by mouth daily. 12/18/19   [provider]  valsartan (DIOVAN) 320 MG tablet Take 320 mg by mouth daily. 12/18/19   [provider]     Objective    Physical Exam: Vitals:   12/31/19 1515 12/31/19 1530 12/31/19 1545 12/31/19 1600  BP:  (!) 117/39  117/66  Pulse:      Resp: '17 16 19 20  ' Temp:      TempSrc:      SpO2:        General: appears to be stated age; somnolent, will briefly open eyes to verbal stimuli, but nonverbal and unable to follow instructions. Skin: warm, dry, no rash Head:  AT/[place] Eyes:  PEARL b/l, EOMI Mouth:  Oral mucosa membranes appear dry, normal dentition Neck: supple;  trachea midline Heart:  RRR; did not appreciate any M/R/G Lungs: CTAB, did not appreciate any wheezes, rales, or rhonchi Abdomen: + BS; soft, ND, NT Vascular: 2+ pedal pulses b/l; 2+ radial pulses b/l Extremities: no peripheral edema, no muscle wasting Neuro: In the setting of altered mental status, and associated inability to follow instructions at the present time, unable to perform full neurologic assessment at this time; although patient noted to spontaneously move all 4 extremities.    Labs on Admission: I have personally reviewed following labs and imaging studies  CBC: Recent Labs  Lab 12/31/19 1528  WBC 4.6  NEUTROABS 3.3  HGB 11.3*  HCT 36.9  MCV 100.5*  PLT 929   Basic Metabolic Panel: Recent Labs  Lab 12/31/19 1528  NA 143  K 3.8  CL 109  CO2 24  GLUCOSE 92  BUN 29*  CREATININE 1.33*  CALCIUM 8.1*   GFR: CrCl cannot be calculated (Unknown ideal weight.). Liver Function Tests: Recent Labs  Lab 12/31/19 1528  AST 15  ALT 13  ALKPHOS 57  BILITOT 0.5  PROT 5.6*  ALBUMIN 2.6*   No results for input(s): LIPASE, AMYLASE in the last 168 hours. No results for input(s): AMMONIA in the last 168 hours. Coagulation Profile: Recent Labs  Lab 12/31/19 1528  INR 1.2   Cardiac Enzymes: No results for input(s): CKTOTAL, CKMB, CKMBINDEX, TROPONINI in the last 168 hours. BNP (last 3 results) No results for input(s): PROBNP in the last 8760 hours. HbA1C: No results for input(s): HGBA1C in the last 72 hours. CBG: Recent Labs  Lab 12/31/19 1411  GLUCAP 89   Lipid Profile: No results for input(s): CHOL, HDL, LDLCALC, TRIG, CHOLHDL, LDLDIRECT in the last 72 hours. Thyroid Function Tests: No results for input(s): TSH, T4TOTAL, FREET4, T3FREE, THYROIDAB in the last 72 hours. Anemia Panel: No results for input(s): VITAMINB12, FOLATE, FERRITIN, TIBC, IRON, RETICCTPCT in the last 72 hours. Urine analysis:    Component Value Date/Time   COLORURINE YELLOW  12/31/2019 1427   APPEARANCEUR CLEAR 12/31/2019 1427   LABSPEC 1.017 12/31/2019 1427   PHURINE 6.0 12/31/2019 1427   GLUCOSEU NEGATIVE 12/31/2019 1427   HGBUR NEGATIVE 12/31/2019 [place] 12/31/2019 1427   [place] 12/31/2019 1427   PROTEINUR 30 (A) 12/31/2019 1427   NITRITE NEGATIVE 12/31/2019 1427   LEUKOCYTESUR LARGE (A) 12/31/2019 1427    Radiological Exams on Admission: CT Head Wo Contrast  Result Date: 12/31/2019 CLINICAL DATA:  Altered mental status. EXAM: CT HEAD WITHOUT CONTRAST TECHNIQUE: Contiguous axial images were obtained from the base of the skull through the vertex without intravenous contrast. COMPARISON:  September 29, 2019. FINDINGS: Brain: Mild diffuse cortical atrophy is noted. Mild chronic ischemic white matter disease is noted. No mass effect or midline shift is noted. Ventricular size is within normal limits. There is no evidence of mass lesion, hemorrhage or acute infarction. Vascular: No hyperdense vessel or unexpected calcification. Skull: Normal. Negative for fracture or focal lesion. Sinuses/Orbits: No acute finding. Other: None. IMPRESSION: Mild diffuse cortical atrophy. Mild chronic ischemic white matter disease. No acute intracranial abnormality seen. Electronically Signed   By: Marijo Conception M.D.   On: 12/31/2019 15:10   DG Chest Port 1 View  Result Date: 12/31/2019 CLINICAL DATA:  Altered mental status. Unresponsive. EXAM: PORTABLE CHEST 1 VIEW COMPARISON:  12/06/2019 FINDINGS: Numerous leads and wires project over the chest. Surgical clips about the right chest wall and right axilla. Mild cardiomegaly. Atherosclerosis in the transverse aorta. No pleural effusion or pneumothorax. Nonspecific interstitial thickening. Subsegmental atelectasis at both lung bases. No lobar consolidation. Osteopenia. IMPRESSION: 1. No acute cardiopulmonary disease. 2. Cardiomegaly with mild nonspecific interstitial thickening. Electronically Signed   By:  Abigail Miyamoto M.D.   On: 12/31/2019 14:37    Assessment/Plan   JYLA HOPF is a 86 y.o. female with medical history significant for dementia, hypertension, hyperlipidemia who is admitted to [facility] on 12/31/2019 with acute metabolic encephalopathy in the setting of COVID-19 and urinary tract infection after presenting from [facility] to [facility] Emergency Department via EMS for evaluation of altered mental status.    Principal Problem:   Acute metabolic encephalopathy Active Problems:   HLD (hyperlipidemia)   Benign essential HTN   COVID-19 virus infection   Acute cystitis   AKI (acute kidney injury) ([place])   #) Acute metabolic encephalopathy: 1 day of progressive confusion, lethargy, somnolence, and subsequent inability to follow instructions superimposed on baseline dementia,  of which baseline mental status is not entirely clear at the present time.  This appears to be multifactorial in nature, with contributions from presenting COVID-19, urinary tract infection, and mild dehydration with associated acute kidney injury.  Patient is currently somnolent, will briefly open eyes to verbal stimuli, but nonverbal, and unable to follow instructions.  While unable to perform full neurologic assessment in the setting, acute ischemic CVAs felt to be less likely as the patient was noted to spontaneously move all 4 extremities, while presenting CT the head showed no evidence of acute intracranial process.   Plan: Work-up and management of presenting COVID-19 infection as well as presenting acute cystitis, as described below.  Check TSH.  Check ABG to evaluate for any element of hypercapnic encephalopathy.  Repeat CMP in the morning.  We will keep patient n.p.o. until mental status improves sufficiently such that she is able to participate in and pass nursing bedside swallowing screen.  Gentle IV fluids, as further described below. Will attempt to further establish specifics of patient's  baseline mental status.      #) COVID-19 infection: After known COVID-19 exposure in the form of the patient's roommate who was diagnosed with COVID-19 on 12/30/2019, the patient developed altered mental status, as described above, with ensuing COVID-19 PCR performed in the ED today found to be positive.  Of note, presentation is not associate with any hypoxia.  Therefore her COVID-19 infection does not meet criteria to be considered severe nature, and criteria are not met for initiation of Decadron or remdesivir at the present time.  Additionally, presenting chest x-ray shows no evidence of acute cardiopulmonary process, as further described above.  Presentation is not associated with sepsis at this time, and the patient appears hemodynamically stable.  No known chronic underlying pulmonary conditions. Will add on general similar markers to labs already collected in the ED.   Plan: airborne and contact precautions with eye protection. Monitor continuous pulse oximetry. prn supplemental O2 to maintain O2 sats greater than or equal to 92%. monitor on telemetry.  Check general inflammatory markers, as described above.  Check ABG now for the purpose of evaluating PaO2 to FiO2 ratio.  At onset magnesium level labs already collected in the ED.  Check procalcitonin and serum phosphorus level.  Repeat CBC with differential in the morning.  As needed albuterol inhaler.      #) Acute cystitis: Diagnosis on the basis of presenting acute encephalopathy, with urinalysis showing significant pyuria with greater than 50 white blood cells in addition to large leukocyte esterase in the absence of any squamous epithelial cells.  As described above, presentation does not meet criteria for sepsis at this time. Will require 3-5 days of antibiotic coverage for uncomplicated urinary tract infection.  Of note, the patient received a dose of Rocephin in the ED following collection of blood cultures x2.  Plan: Continue  Rocephin.  Monitor for results of blood cultures x2 collected in the ED today.  Repeat CBC with differential in the morning.       #) Acute kidney injury: Presenting creatinine found to be 1.33, which is suspected to represent an acute kidney injury given relative increased from most recent prior creatinine of 0.88 when checked in March 2018.  Appears to be prerenal in nature in the setting of intravascular depletion stemming from dehydration from diminished oral intake, consistent with ending of acute prerenal azotemia.  Urinalysis notable for 30 protein.  Potential pharmacologic exacerbation due to outpatient valsartan.  Plan: Check  random urine sodium as well as random urine creatinine.  Monitor strict I's and O's and daily weights.  Attempt avoid nephrotoxic agents.  Repeat BMP in the morning.  IV normal saline at 50 cc/h.  Hold home valsartan.      #) History of essential hypertension: Outpatient antihypertensive regimen includes Norvasc, Lopressor, and valsartan.  Recorded blood pressures since presenting to the emergency department today appear to be normotensive.   Plan: In the setting of current n.p.o. status as well as multiple presenting infectious processes, will hold home antihypertensive medications for now.  Close monitoring of ensuing blood pressure via routine vital signs.      #) Hyperlipidemia: On atorvastatin as an outpatient.  Plan: In the setting of current n.p.o. status, will hold home atorvastatin for now.    DVT prophylaxis: Heparin 5000 units subcu 3 times daily. Code Status: DNR (per most recent code status documented in [place] and reportedly confirmed via documentation provided by [facility]). Family Communication: none Disposition Plan:  Per Rounding Team Consults called: None Admission status: Inpatient;  MedSurg at [facility].    PLEASE NOTE THAT DRAGON DICTATION SOFTWARE WAS USED IN THE CONSTRUCTION OF THIS NOTE.   [facility] Pager [phone] From 3PM- 11PM.   Otherwise, please contact night-coverage  [email or web address] Password [facility]  12/31/2019, 5:15 PM

## 2020-01-01 DIAGNOSIS — G9341 Metabolic encephalopathy: Secondary | ICD-10-CM

## 2020-01-01 DIAGNOSIS — N3000 Acute cystitis without hematuria: Secondary | ICD-10-CM | POA: Diagnosis present

## 2020-01-01 DIAGNOSIS — U071 COVID-19: Secondary | ICD-10-CM | POA: Diagnosis present

## 2020-01-01 DIAGNOSIS — N179 Acute kidney failure, unspecified: Secondary | ICD-10-CM | POA: Diagnosis present

## 2020-01-01 LAB — COMPREHENSIVE METABOLIC PANEL
ALT: 11 U/L (ref 0–44)
AST: 16 U/L (ref 15–41)
Albumin: 3 g/dL — ABNORMAL LOW (ref 3.5–5.0)
Alkaline Phosphatase: 61 U/L (ref 38–126)
Anion gap: 15 (ref 5–15)
BUN: 25 mg/dL — ABNORMAL HIGH (ref 8–23)
CO2: 21 mmol/L — ABNORMAL LOW (ref 22–32)
Calcium: 9.1 mg/dL (ref 8.9–10.3)
Chloride: 109 mmol/L (ref 98–111)
Creatinine, Ser: 0.94 mg/dL (ref 0.44–1.00)
GFR calc Af Amer: >60 mL/min (ref >60)
GFR calc non Af Amer: 53 mL/min — ABNORMAL LOW (ref >60)
Glucose, Bld: 67 mg/dL — ABNORMAL LOW (ref 70–99)
Potassium: 3.8 mmol/L (ref 3.5–5.1)
Sodium: 145 mmol/L (ref 135–145)
Total Bilirubin: 0.5 mg/dL (ref 0.3–1.2)
Total Protein: 6 g/dL — ABNORMAL LOW (ref 6.5–8.1)

## 2020-01-01 LAB — BLOOD GAS, ARTERIAL
Acid-base deficit: 0.5 mmol/L (ref 0.0–2.0)
Bicarbonate: 24.2 mmol/L (ref 20.0–28.0)
Drawn by: 41977
FIO2: 21
O2 Saturation: 93.2 %
Patient temperature: 37
pCO2 arterial: 35.5 mmHg (ref 32.0–48.0)
pH, Arterial: 7.432 (ref 7.350–7.450)
pO2, Arterial: 67.7 mmHg — ABNORMAL LOW (ref 83.0–108.0)

## 2020-01-01 LAB — CBC WITH DIFFERENTIAL/PLATELET
Abs Immature Granulocytes: 0.01 10*3/uL (ref 0.00–0.07)
Basophils Absolute: 0 10*3/uL (ref 0.0–0.1)
Basophils Relative: 0 %
Eosinophils Absolute: 0.1 10*3/uL (ref 0.0–0.5)
Eosinophils Relative: 2 %
HCT: 43.1 % (ref 36.0–46.0)
Hemoglobin: 13.2 g/dL (ref 12.0–15.0)
Immature Granulocytes: 0 %
Lymphocytes Relative: 20 %
Lymphs Abs: 0.8 10*3/uL (ref 0.7–4.0)
MCH: 30.6 pg (ref 26.0–34.0)
MCHC: 30.6 g/dL (ref 30.0–36.0)
MCV: 100 fL (ref 80.0–100.0)
Monocytes Absolute: 0.4 10*3/uL (ref 0.1–1.0)
Monocytes Relative: 10 %
Neutro Abs: 2.8 10*3/uL (ref 1.7–7.7)
Neutrophils Relative %: 68 %
Platelets: 176 10*3/uL (ref 150–400)
RBC: 4.31 MIL/uL (ref 3.87–5.11)
RDW: 13.8 % (ref 11.5–15.5)
WBC: 4.1 10*3/uL (ref 4.0–10.5)
nRBC: 0 % (ref 0.0–0.2)

## 2020-01-01 LAB — FERRITIN: Ferritin: 100 ng/mL (ref 11–307)

## 2020-01-01 LAB — FIBRINOGEN: Fibrinogen: 276 mg/dL (ref 210–475)

## 2020-01-01 LAB — TSH: TSH: 0.442 u[IU]/mL (ref 0.350–4.500)

## 2020-01-01 LAB — MAGNESIUM: Magnesium: 2 mg/dL (ref 1.7–2.4)

## 2020-01-01 LAB — C-REACTIVE PROTEIN: CRP: 1.8 mg/dL — ABNORMAL HIGH (ref <1.0)

## 2020-01-01 LAB — CREATININE, URINE, RANDOM: Creatinine, Urine: 218.49 mg/dL

## 2020-01-01 LAB — MRSA PCR SCREENING: MRSA by PCR: NEGATIVE

## 2020-01-01 LAB — PHOSPHORUS: Phosphorus: 3 mg/dL (ref 2.5–4.6)

## 2020-01-01 LAB — D-DIMER, QUANTITATIVE: D-Dimer, Quant: >20 ug/mL-FEU — ABNORMAL HIGH (ref 0.00–0.50)

## 2020-01-01 LAB — SODIUM, URINE, RANDOM: Sodium, Ur: 98 mmol/L

## 2020-01-01 MED ORDER — KCL IN DEXTROSE-NACL 20-5-0.45 MEQ/L-%-% IV SOLN: INTRAVENOUS | Status: DC

## 2020-01-01 NOTE — Plan of Care (Signed)

## 2020-01-01 NOTE — Progress Notes (Signed)
Patient Demographics:    Michelle Sharp, is a 86 y.o. female, DOB - [birth year], VZD:[id number]  Admit date - 12/31/2019   Admitting Physician Rhetta Mura, DO  Outpatient Primary MD for the patient is Glenda Chroman, MD  LOS - 1   Chief Complaint  Patient presents with  . Altered Mental Status        Subjective:    Michelle Sharp today has no fevers, no emesis,  No chest pain,  -oral intake is not great  Assessment  & Plan :    Principal Problem:   Acute metabolic encephalopathy Active Problems:   HLD (hyperlipidemia)   Benign essential HTN   COVID-19 virus infection   Acute cystitis   AKI (acute kidney injury) ([place])   Brief Summary:- 86 y.o. female with medical history significant for dementia, hypertension, hyperlipidemia who is admitted to [facility] on 12/31/2019 with acute metabolic encephalopathy in the setting of COVID-19 and urinary tract infection after presenting from [facility]   A/p 1) COVID-19 respiratory infection--- no significant hypoxia, patient currently does not meet criteria for remdesivir or steroids -Symptomatic and supportive treatment for now  2) UTI--continue IV Rocephin pending cultures  3)AKI----acute kidney injury on CKD stage -due to dehydration and a UTI   creatinine on admission=1.33  , baseline creatinine = 0.88   , creatinine is now= 0.94  , renally adjust medications, avoid nephrotoxic agents / dehydration  / hypotension  4)HTN and HLD--- stable, resume preadmission medications    Disposition/Need for in-Hospital Stay- patient unable to be discharged at this time due to --- UTI requiring IV antibiotics pending culture results  Code Status : DNR  Family Communication:  None available  Disposition Plan  : [facility]  Consults  :  na  DVT Prophylaxis  :    - Heparin - SCDs   Lab Results  Component Value Date   PLT 176 01/01/2020      Inpatient Medications  Scheduled Meds: . heparin  5,000 Units Subcutaneous Q8H  . pantoprazole (PROTONIX) IV  40 mg Intravenous Q24H  . sodium chloride flush  3 mL Intravenous Q12H   Continuous Infusions: . sodium chloride 500 mL (12/31/19 1635)  . cefTRIAXone (ROCEPHIN)  IV 1 g (01/01/20 1513)  . dextrose 5 % and 0.45 % NaCl with KCl 20 mEq/L 75 mL/hr at 01/01/20 0947   PRN Meds:.sodium chloride, acetaminophen **OR** acetaminophen, albuterol    Anti-infectives (From admission, onward)   Start     Dose/Rate Route Frequency Ordered Stop   01/01/20 1400  cefTRIAXone (ROCEPHIN) 1 g in sodium chloride 0.9 % 100 mL IVPB     1 g 200 mL/hr over 30 Minutes Intravenous Every 24 hours 12/31/19 1820     12/31/19 1530  cefTRIAXone (ROCEPHIN) 1 g in sodium chloride 0.9 % 100 mL IVPB     1 g 200 mL/hr over 30 Minutes Intravenous  Once 12/31/19 1528 12/31/19 1706        Objective:   Vitals:   12/31/19 2100 12/31/19 2247 01/01/20 0523 01/01/20 1916  BP: (!) 108/42 (!) 130/57 (!) 105/91   Pulse: 81 94 87   Resp: 20 18 20    Temp:  99.8 F (37.7 C)  99 F (37.2 C)   TempSrc:  Oral Oral   SpO2: 94% 97% 93%   Weight:    102 kg  Height:    5\' 7"  (1.702 m)    Wt Readings from Last 3 Encounters:  01/01/20 102 kg  06/17/19 66.6 kg  07/17/18 66.7 kg     Intake/Output Summary (Last 24 hours) at 01/01/2020 1921 Last data filed at 01/01/2020 1800 Gross per 24 hour  Intake 1104.17 ml  Output 1102 ml  Net 2.17 ml   Physical Exam Gen:-Somewhat sleepy, in no acute distress  HEENT:- [place].AT, No sclera icterus Neck-Supple Neck,No JVD,.  Lungs-diminished in bases, no wheezing CV- S1, S2 normal, regular  Abd-  +ve B.Sounds, Abd Soft, No tenderness, no CVA area tenderness Extremity/Skin:- No  edema, pedal pulses present  Psych-affect is appropriate, underlying/baseline cognitive and memory deficits  neuro-generalized weakness, no new focal deficits, no tremors   Data Review:   Micro  Results Recent Results (from the past 240 hour(s))  Respiratory Panel by RT PCR (Flu A&B, Covid) - Nasopharyngeal Swab     Status: Abnormal   Collection Time: 12/31/19  2:55 PM   Specimen: Nasopharyngeal Swab  Result Value Ref Range Status   SARS Coronavirus 2 by RT PCR POSITIVE (A) NEGATIVE Final    Comment: RESULT CALLED TO, READ BACK BY AND VERIFIED WITH: HOLCOMB,R ON 12/31/19 AT 1645 BY LOY,C (NOTE) SARS-CoV-2 target nucleic acids are DETECTED. SARS-CoV-2 RNA is generally detectable in upper respiratory specimens  during the acute phase of infection. Positive results are indicative of the presence of the identified virus, but do not rule out bacterial infection or co-infection with other pathogens not detected by the test. Clinical correlation with patient history and other diagnostic information is necessary to determine patient infection status. The expected result is Negative. Fact Sheet for Patients:  01/02/20 Fact Sheet for Healthcare Providers: https://www.moore.com/ This test is not yet approved or cleared by the https://www.young.biz/ FDA and  has been authorized for detection and/or diagnosis of SARS-CoV-2 by FDA under an Emergency Use Authorization (EUA).  This EUA will remain in effect (meaning this test can be used)  for the duration of  the COVID-19 declaration under Section 564(b)(1) of the Act, 21 U.S.C. section 360bbb-3(b)(1), unless the authorization is terminated or revoked sooner.    Influenza A by PCR NEGATIVE NEGATIVE Final   Influenza B by PCR NEGATIVE NEGATIVE Final    Comment: (NOTE) The Xpert Xpress SARS-CoV-2/FLU/RSV assay is intended as an aid in  the diagnosis of influenza from Nasopharyngeal swab specimens and  should not be used as a sole basis for treatment. Nasal washings and  aspirates are unacceptable for Xpert Xpress SARS-CoV-2/FLU/RSV  testing. Fact Sheet for  Patients: [place] Fact Sheet for Healthcare Providers: https://www.moore.com/ This test is not yet approved or cleared by the https://www.young.biz/ FDA and  has been authorized for detection and/or diagnosis of SARS-CoV-2 by  FDA under an Emergency Use Authorization (EUA). This EUA will remain  in effect (meaning this test can be used) for the duration of the  Covid-19 declaration under Section 564(b)(1) of the Act, 21  U.S.C. section 360bbb-3(b)(1), unless the authorization is  terminated or revoked. Performed at [facility], [address], [place], [place]   Blood Culture (routine x 2)     Status: None (Preliminary result)   Collection Time: 12/31/19  3:29 PM   Specimen: Right Antecubital; Blood  Result Value Ref Range Status   Specimen  Description RIGHT ANTECUBITAL  Final   Special Requests   Final    BOTTLES DRAWN AEROBIC AND ANAEROBIC Blood Culture adequate volume   Culture   Final    NO GROWTH < 24 HOURS Performed at [facility], [address], [place], [place] [address]    Report Status PENDING  Incomplete  Blood Culture (routine x 2)     Status: None (Preliminary result)   Collection Time: 12/31/19  3:29 PM   Specimen: BLOOD RIGHT HAND  Result Value Ref Range Status   Specimen Description BLOOD RIGHT HAND  Final   Special Requests   Final    BOTTLES DRAWN AEROBIC ONLY Blood Culture adequate volume   Culture   Final    NO GROWTH < 24 HOURS Performed at [facility], [address] [name], [place] [address]    Report Status PENDING  Incomplete  MRSA PCR Screening     Status: None   Collection Time: 12/31/19 11:28 PM   Specimen: Nasal Mucosa; Nasopharyngeal  Result Value Ref Range Status   MRSA by PCR NEGATIVE NEGATIVE Final    Comment:        The GeneXpert MRSA Assay (FDA approved for NASAL specimens only), is one component of a comprehensive MRSA colonization surveillance program. It is not intended to  diagnose MRSA infection nor to guide or monitor treatment for MRSA infections. Performed at [facility], [address], [place], [place] [address]     Radiology Reports CT Head Wo Contrast  Result Date: 12/31/2019 CLINICAL DATA:  Altered mental status. EXAM: CT HEAD WITHOUT CONTRAST TECHNIQUE: Contiguous axial images were obtained from the base of the skull through the vertex without intravenous contrast. COMPARISON:  September 29, 2019. FINDINGS: Brain: Mild diffuse cortical atrophy is noted. Mild chronic ischemic white matter disease is noted. No mass effect or midline shift is noted. Ventricular size is within normal limits. There is no evidence of mass lesion, hemorrhage or acute infarction. Vascular: No hyperdense vessel or unexpected calcification. Skull: Normal. Negative for fracture or focal lesion. Sinuses/Orbits: No acute finding. Other: None. IMPRESSION: Mild diffuse cortical atrophy. Mild chronic ischemic white matter disease. No acute intracranial abnormality seen. Electronically Signed   By: Lupita Raider M.D.   On: 12/31/2019 15:10   DG Chest Port 1 View  Result Date: 12/31/2019 CLINICAL DATA:  Altered mental status. Unresponsive. EXAM: PORTABLE CHEST 1 VIEW COMPARISON:  12/06/2019 FINDINGS: Numerous leads and wires project over the chest. Surgical clips about the right chest wall and right axilla. Mild cardiomegaly. Atherosclerosis in the transverse aorta. No pleural effusion or pneumothorax. Nonspecific interstitial thickening. Subsegmental atelectasis at both lung bases. No lobar consolidation. Osteopenia. IMPRESSION: 1. No acute cardiopulmonary disease. 2. Cardiomegaly with mild nonspecific interstitial thickening. Electronically Signed   By: Jeronimo Greaves M.D.   On: 12/31/2019 14:37     CBC Recent Labs  Lab 12/31/19 1528 01/01/20 0544  WBC 4.6 4.1  HGB 11.3* 13.2  HCT 36.9 43.1  PLT 159 176  MCV 100.5* 100.0  MCH 30.8 30.6  MCHC 30.6 30.6  RDW 13.9 13.8  LYMPHSABS  0.9 0.8  MONOABS 0.4 0.4  EOSABS 0.1 0.1  BASOSABS 0.0 0.0    Chemistries  Recent Labs  Lab 12/31/19 1528 01/01/20 0544  NA 143 145  K 3.8 3.8  CL 109 109  CO2 24 21*  GLUCOSE 92 67*  BUN 29* 25*  CREATININE 1.33* 0.94  CALCIUM 8.1* 9.1  MG 1.7 2.0  AST 15  16  ALT 13 11  ALKPHOS 57 61  BILITOT 0.5 0.5   ------------------------------------------------------------------------------------------------------------------ No results for input(s): CHOL, HDL, LDLCALC, TRIG, CHOLHDL, LDLDIRECT in the last 72 hours.  Lab Results  Component Value Date   HGBA1C 5.3 02/26/2017   ------------------------------------------------------------------------------------------------------------------ Recent Labs    01/01/20 0544  TSH 0.442   ------------------------------------------------------------------------------------------------------------------ Recent Labs    12/31/19 1528 01/01/20 0544  FERRITIN 74 100    Coagulation profile Recent Labs  Lab 12/31/19 1528  INR 1.2    Recent Labs    12/31/19 1528 01/01/20 0544  DDIMER 6.56* >20.00*    Cardiac Enzymes No results for input(s): CKMB, TROPONINI, MYOGLOBIN in the last 168 hours.  Invalid input(s): CK ------------------------------------------------------------------------------------------------------------------ No results found for: BNP   Shon Hale M.D on 01/01/2020 at 7:21 PM  Go to [email or web address] - for contact info  [facility] - Office  [phone]

## 2020-01-01 NOTE — Plan of Care (Signed)
°  Problem: Education: °Goal: Knowledge of General Education information will improve °Description: Including pain rating scale, medication(s)/side effects and non-pharmacologic comfort measures °Outcome: Not Progressing °  °Problem: Health Behavior/Discharge Planning: °Goal: Ability to manage health-related needs will improve °Outcome: Not Progressing °  °Problem: Clinical Measurements: °Goal: Ability to maintain clinical measurements within normal limits will improve °Outcome: Progressing °Goal: Will remain free from infection °Outcome: Progressing °Goal: Diagnostic test results will improve °Outcome: Progressing °Goal: Respiratory complications will improve °Outcome: Progressing °Goal: Cardiovascular complication will be avoided °Outcome: Progressing °  °Problem: Activity: °Goal: Risk for activity intolerance will decrease °Outcome: Not Progressing °  °Problem: Nutrition: °Goal: Adequate nutrition will be maintained °Outcome: Not Progressing °  °Problem: Coping: °Goal: Level of anxiety will decrease °Outcome: Not Progressing °  °Problem: Elimination: °Goal: Will not experience complications related to bowel motility °Outcome: Progressing °Goal: Will not experience complications related to urinary retention °Outcome: Progressing °  °Problem: Pain Managment: °Goal: General experience of comfort will improve °Outcome: Progressing °  °Problem: Safety: °Goal: Ability to remain free from injury will improve °Outcome: Progressing °  °Problem: Skin Integrity: °Goal: Risk for impaired skin integrity will decrease °Outcome: Progressing °  °

## 2020-01-02 LAB — URINE CULTURE: Culture: NO GROWTH

## 2020-01-02 LAB — BASIC METABOLIC PANEL
Anion gap: 7 (ref 5–15)
BUN: 16 mg/dL (ref 8–23)
CO2: 24 mmol/L (ref 22–32)
Calcium: 8.5 mg/dL — ABNORMAL LOW (ref 8.9–10.3)
Chloride: 109 mmol/L (ref 98–111)
Creatinine, Ser: 0.77 mg/dL (ref 0.44–1.00)
GFR calc Af Amer: >60 mL/min (ref >60)
GFR calc non Af Amer: >60 mL/min (ref >60)
Glucose, Bld: 95 mg/dL (ref 70–99)
Potassium: 3.8 mmol/L (ref 3.5–5.1)
Sodium: 140 mmol/L (ref 135–145)

## 2020-01-02 MED ORDER — OMEPRAZOLE 20 MG PO CPDR: 20.0000 mg | DELAYED_RELEASE_CAPSULE | Freq: Every day | ORAL | 1 refills | Status: AC

## 2020-01-02 MED ORDER — ACETAMINOPHEN 325 MG PO TABS: 650.0000 mg | ORAL_TABLET | Freq: Four times a day (QID) | ORAL | 1 refills | Status: AC | PRN

## 2020-01-02 MED ORDER — ALBUTEROL SULFATE HFA 108 (90 BASE) MCG/ACT IN AERS: 2.0000 | INHALATION_SPRAY | RESPIRATORY_TRACT | 0 refills | Status: AC | PRN

## 2020-01-02 MED ORDER — AMLODIPINE BESYLATE 10 MG PO TABS: 10.0000 mg | ORAL_TABLET | Freq: Every day | ORAL | 2 refills | Status: AC

## 2020-01-02 MED ORDER — ENSURE COMPLETE PO LIQD: 237.0000 mL | Freq: Two times a day (BID) | ORAL | 1 refills | Status: AC

## 2020-01-02 NOTE — [place] FL2 (Signed)
[place] MEDICAID FL2 LEVEL OF CARE SCREENING TOOL     IDENTIFICATION  Patient Name: Michelle Sharp Birthdate: July 09, 1926 Sex: female Admission Date (Current Location): 12/31/2019  [facility] and [place] Number:  [employer] and Address:  [place] [address], [place]      Provider Number: [id number]  Attending Physician Name and Address:  Roxan Hockey, MD  Relative Name and Phone Number:       Current Level of Care: Hospital Recommended Level of Care: [place] Prior Approval Number:    Date Approved/Denied:   PASRR Number:    Discharge Plan: SNF    Current Diagnoses: Patient Active Problem List   Diagnosis Date Noted  . Acute metabolic encephalopathy 44/12/270  . COVID-19 virus infection 01/01/2020  . Acute cystitis 01/01/2020  . AKI (acute kidney injury) ([place]) 01/01/2020  . Acute encephalopathy 12/31/2019  . Dementia ([place]) 02/27/2017  . NSTEMI (non-ST elevated myocardial infarction) ([place]) 02/26/2017  . HLD (hyperlipidemia) 09/16/2009  . HYPERKALEMIA 09/16/2009  . Benign essential HTN 09/16/2009  . PALPITATIONS 09/16/2009    Orientation RESPIRATION BLADDER Height & Weight     Self  Normal Incontinent Weight: 116 lb 10 oz (52.9 kg) Height:  5\' 7"  (170.2 cm)  BEHAVIORAL SYMPTOMS/MOOD NEUROLOGICAL BOWEL NUTRITION STATUS      Incontinent Diet(see discharge summary)  AMBULATORY STATUS COMMUNICATION OF NEEDS Skin   Extensive Assist Verbally PU Stage and Appropriate Care(sacrum posterior)                       Personal Care Assistance Level of Assistance  Bathing, Dressing, Feeding Bathing Assistance: Maximum assistance Feeding assistance: Limited assistance Dressing Assistance: Maximum assistance     Functional Limitations Info  Sight, Hearing, Speech Sight Info: Adequate Hearing Info: Adequate Speech Info: Adequate    SPECIAL CARE FACTORS FREQUENCY                        Contractures Contractures Info: Not present    Additional Factors Info  Code Status, Allergies, Psychotropic, Isolation Precautions Code Status Info: DNR Allergies Info: Codeine Psychotropic Info: Risperdal   Isolation Precautions Info: COVID+ 12/31/19     Current Medications (01/02/2020):  This is the current hospital active medication list Current Facility-Administered Medications  Medication Dose Route Frequency Provider Last Rate Last Admin  . 0.9 %  sodium chloride infusion   Intravenous PRN Howerter, Justin B, DO 10 mL/hr at 12/31/19 1635 500 mL at 12/31/19 1635  . acetaminophen (TYLENOL) tablet 650 mg  650 mg Oral Q6H PRN Howerter, Justin B, DO       Or  . acetaminophen (TYLENOL) suppository 650 mg  650 mg Rectal Q6H PRN Howerter, Justin B, DO      . albuterol (VENTOLIN HFA) 108 (90 Base) MCG/ACT inhaler 1-2 puff  1-2 puff Inhalation Q4H PRN Howerter, Justin B, DO      . cefTRIAXone (ROCEPHIN) 1 g in sodium chloride 0.9 % 100 mL IVPB  1 g Intravenous Q24H Howerter, Justin B, DO 200 mL/hr at 01/02/20 1327 1 g at 01/02/20 1327  . dextrose 5 % and 0.45 % NaCl with KCl 20 mEq/L infusion   Intravenous Continuous Emokpae, Courage, MD 75 mL/hr at 01/02/20 1210 New Bag at 01/02/20 1210  . heparin injection 5,000 Units  5,000 Units Subcutaneous Q8H Howerter, Justin B, DO   5,000 Units at 01/02/20 1327  . pantoprazole (PROTONIX) injection 40  mg  40 mg Intravenous Q24H Howerter, Justin B, DO   40 mg at 01/02/20 0829  . sodium chloride flush (NS) 0.9 % injection 3 mL  3 mL Intravenous Q12H Howerter, Justin B, DO   3 mL at 01/01/20 0355     Discharge Medications: Please see discharge summary for a list of discharge medications.  Relevant Imaging Results:  Relevant Lab Results:   Additional Information    Cambri Plourde, Juleen China, LCSW

## 2020-01-02 NOTE — Discharge Summary (Signed)
Michelle Sharp, is a 86 y.o. female  DOB 09-Jul-1926  MRN [id number].  Admission date:  12/31/2019  Admitting Physician  Angie Fava, DO  Discharge Date:  01/02/2020   Primary MD  Ignatius Specking, MD  Recommendations for primary care physician for things to follow:   1)Avoid ibuprofen/Advil/Aleve/Motrin/Goody Powders/Naproxen/BC powders/Meloxicam/Diclofenac/Indomethacin and other Nonsteroidal anti-inflammatory medications as these will make you more likely to bleed and can cause stomach ulcers, can also cause Kidney problems.   2) You are strongly advised to  to isolate/quarantine for at least 21 days from the date of your diagnoses with COVID-19 infection--please always wear a mask if you have to go outside the house  3) repeat CBC and repeat BMP on Monday, 01/06/2020  4) please give Ensure supplements 1 can twice a day--- patient has poor appetite and poor oral intake so please ,please avoid any restrictions on patient diet patient should be able to eat whatever she wants  Admission Diagnosis  Acute encephalopathy [G93.40] Urinary tract infection without hematuria, site unspecified [N39.0] Altered mental status, unspecified altered mental status type [R41.82]   Discharge Diagnosis  Acute encephalopathy [G93.40] Urinary tract infection without hematuria, site unspecified [N39.0] Altered mental status, unspecified altered mental status type [R41.82]    Principal Problem:   Acute metabolic encephalopathy Active Problems:   HLD (hyperlipidemia)   Benign essential HTN   COVID-19 virus infection   Acute cystitis   AKI (acute kidney injury) (HCC)      Past Medical History:  Diagnosis Date  . Hypertension     Past Surgical History:  Procedure Laterality Date  . CHOLECYSTECTOMY    . MASTECTOMY       HPI  from the history and physical done on the day of admission:    Chief Complaint: Altered  mental status  HPI: Michelle Sharp is a 86 y.o. female with medical history significant for dementia, hypertension, hyperlipidemia who is admitted to [facility] on 12/31/2019 with acute metabolic encephalopathy in the setting of COVID-19 and urinary tract infection after presenting from [facility] SN[facility] to [facility] Emergency Department via EMS for evaluation of altered mental status.   In the setting of acute encephalopathy superimposed on dementia, the following history is obtained via my discussions with the emergency department physician as well as via chart review.   Per [facility] staff, the patient has exhibited 1 day of progressive confusion, somnolence, and diminished responsiveness, all of which reportedly represented apart from the patient since baseline mental status, although the specifics of her baseline mental status are not completely clear at this time.  Of note, the patient's roommate at [facility] was reportedly diagnosed with COVID-19 yesterday (12/30/19).  Per chart review, no documentation chronic conditions.  Additionally, it appears that the patient is a former smoker, completely quit smoking in the 1980s after an unclear duration of smoking.    ED Course:  Vital signs in the ED were notable for the following: Temperature max 99.3; heart rate  84-85; blood pressure 102/45 117/66; respiratory rate 15-20, and oxygen saturation 92 to 98% on room air.  Labs were notable for the following: CMP notable for sodium 143, bicarbonate 24, BUN 29, creatinine 1.33 relative to most recent prior creatinine data point of 0.88 when checked in March 2018, BUN to creatinine ratio 21.8, albumin 2.6, but otherwise liver enzymes were found to be within normal limits.  CBC notable for white blood cell count of 4600 with 71% neutrophils.  Lactic acid 1.4.  Urinalysis notable for greater than 50 white blood cells, large leukocyte esterase, no squamous epithelial cells, and 30  proteinuria.  Nasopharyngeal COVID-19 PCR performed in the ED today found to be positive.  Blood cultures x2 were collected, while urine sample was sent for culture.  Chest x-ray, per final radiology report showed no evidence of acute cardiopulmonary process, including no evidence of infiltrate, edema, effusion, pneumothorax.  Noncontrast CT the head, per final radiology report showed mild diffuse cortical atrophy with mild chronic ischemic white matter disease in the absence of any acute intracranial process, including no evidence of intracranial hemorrhage or acute ischemic infarct.  While in the ED, the following were administered: In the setting of suspected urinary tract infection, the patient received Rocephin 1 g IV x1.  Additionally she received a 1 L IV normal saline bolus    Hospital Course:     Brief Summary:- 86 y.o.femalewith medical history significant fordementia, hypertension, hyperlipidemiawho is admitted [facility] on 1/19/2021with acute metabolic encephalopathy in the setting of COVID-19 and [facility]  A/p 1) COVID-19 respiratory infection--- no significant hypoxia, patient currently does not meet criteria for remdesivir or steroids -Symptomatic and supportive treatment for now -Clinically stable from a respiratory and Covid 19 infection standpoint  2)  suspected UTI--treated with IV Rocephin x3 doses from 12/31/2019 through 01/02/2020 inclusive, however blood and urine cultures are negative no further antibiotics indicated at this time  3)AKI----acute kidney injury on CKD stage -due to dehydration  creatinine on admission=1.33  , baseline creatinine = 0.88   , creatinine is now= 0.77  , renally adjust medications, avoid nephrotoxic agents / dehydration  / hypotension -Encourage adequate oral intake  4)HTN and HLD--- stable, medications adjusted  5)Diet-- please give Ensure supplements 1 can twice a  day--- patient has poor appetite and poor oral intake so please ,please avoid any restrictions on patient diet patient should be able to eat whatever she wants   Disposition-discharge back to SNF rehab, overall Prognosis is Poor  Code Status : DNR  Family Communication:  Discussed with Ms Milford CageBarbara Smith--- Niece-[phone]--- Questions answered  Disposition Plan  : [facility]  Consults  :  na  Discharge Condition: stable--- overall Prognosis is Poor  Follow UP-- PCP at SNF   Diet and Activity recommendation:  As advised  Discharge Instructions    Discharge Instructions    Call MD for:  difficulty breathing, headache or visual disturbances   Complete by: As directed    Call MD for:  difficulty breathing, headache or visual disturbances   Complete by: As directed    Call MD for:  extreme fatigue   Complete by: As directed    Call MD for:  persistant dizziness or light-headedness   Complete by: As directed    Call MD for:  persistant dizziness or light-headedness   Complete by: As directed    Call MD for:  persistant nausea and vomiting   Complete by:  As directed    Call MD for:  persistant nausea and vomiting   Complete by: As directed    Call MD for:  severe uncontrolled pain   Complete by: As directed    Call MD for:  severe uncontrolled pain   Complete by: As directed    Call MD for:  temperature >100.4   Complete by: As directed    Call MD for:  temperature >100.4   Complete by: As directed    Diet general   Complete by: As directed    Discharge instructions   Complete by: As directed    1)Avoid ibuprofen/Advil/Aleve/Motrin/Goody Powders/Naproxen/BC powders/Meloxicam/Diclofenac/Indomethacin and other Nonsteroidal anti-inflammatory medications as these will make you more likely to bleed and can cause stomach ulcers, can also cause Kidney problems.   2) You are strongly advised to  to isolate/quarantine for at least 21 days from the date of your diagnoses  with COVID-19 infection--please always wear a mask if you have to go outside the house  3) repeat CBC and repeat BMP on Monday, 01/06/2020  4) please give Ensure supplements 1 can twice a day--- patient has poor appetite and poor oral intake so please ,please avoid any restrictions on patient diet patient should be able to eat whatever she wants   Increase activity slowly   Complete by: As directed    Increase activity slowly   Complete by: As directed        Discharge Medications     Allergies as of 01/02/2020      Reactions   Codeine Rash      Medication List    STOP taking these medications   cefUROXime 500 MG tablet Commonly known as: CEFTIN   diclofenac 50 MG EC tablet Commonly known as: VOLTAREN   naproxen 500 MG tablet Commonly known as: NAPROSYN   risperiDONE 0.25 MG tablet Commonly known as: RISPERDAL   valsartan 320 MG tablet Commonly known as: DIOVAN     TAKE these medications   acetaminophen 325 MG tablet Commonly known as: TYLENOL Take 2 tablets (650 mg total) by mouth every 6 (six) hours as needed for mild pain or fever (or Fever >/= 101).   albuterol 108 (90 Base) MCG/ACT inhaler Commonly known as: VENTOLIN HFA Inhale 2 puffs into the lungs every 4 (four) hours as needed for wheezing or shortness of breath.   amLODipine 10 MG tablet Commonly known as: NORVASC Take 1 tablet (10 mg total) by mouth daily. What changed:   medication strength  how much to take   feeding supplement (ENSURE COMPLETE) Liqd Take 237 mLs by mouth 2 (two) times daily between meals.   metoprolol tartrate 100 MG tablet Commonly known as: LOPRESSOR Take 100 mg by mouth every morning.   omeprazole 20 MG capsule Commonly known as: PRILOSEC Take 1 capsule (20 mg total) by mouth daily.       Major procedures and Radiology Reports - PLEASE review detailed and final reports for all details, in brief -    CT Head Wo Contrast  Result Date: 12/31/2019 CLINICAL DATA:   Altered mental status. EXAM: CT HEAD WITHOUT CONTRAST TECHNIQUE: Contiguous axial images were obtained from the base of the skull through the vertex without intravenous contrast. COMPARISON:  September 29, 2019. FINDINGS: Brain: Mild diffuse cortical atrophy is noted. Mild chronic ischemic white matter disease is noted. No mass effect or midline shift is noted. Ventricular size is within normal limits. There is no evidence of mass lesion, hemorrhage or acute  infarction. Vascular: No hyperdense vessel or unexpected calcification. Skull: Normal. Negative for fracture or focal lesion. Sinuses/Orbits: No acute finding. Other: None. IMPRESSION: Mild diffuse cortical atrophy. Mild chronic ischemic white matter disease. No acute intracranial abnormality seen. Electronically Signed   By: Lupita RaiderJames  Green Jr M.D.   On: 12/31/2019 15:10   DG Chest Port 1 View  Result Date: 12/31/2019 CLINICAL DATA:  Altered mental status. Unresponsive. EXAM: PORTABLE CHEST 1 VIEW COMPARISON:  12/06/2019 FINDINGS: Numerous leads and wires project over the chest. Surgical clips about the right chest wall and right axilla. Mild cardiomegaly. Atherosclerosis in the transverse aorta. No pleural effusion or pneumothorax. Nonspecific interstitial thickening. Subsegmental atelectasis at both lung bases. No lobar consolidation. Osteopenia. IMPRESSION: 1. No acute cardiopulmonary disease. 2. Cardiomegaly with mild nonspecific interstitial thickening. Electronically Signed   By: Jeronimo GreavesKyle  Talbot M.D.   On: 12/31/2019 14:37   Micro Results  Recent Results (from the past 240 hour(s))  Urine culture     Status: None   Collection Time: 12/31/19  2:27 PM   Specimen: Urine, Catheterized  Result Value Ref Range Status   Specimen Description   Final    URINE, CATHETERIZED Performed at [facility], [address] [place], [place] [address]    Special Requests   Final    NONE Performed at [facility], [address] [place], [place] [address]     Culture   Final    NO GROWTH Performed at [facility], [address] [place], [place] [address]    Report Status 01/02/2020 FINAL  Final  Respiratory Panel by RT PCR (Flu A&B, Covid) - Nasopharyngeal Swab     Status: Abnormal   Collection Time: 12/31/19  2:55 PM   Specimen: Nasopharyngeal Swab  Result Value Ref Range Status   SARS Coronavirus 2 by RT PCR POSITIVE (A) NEGATIVE Final    Comment: RESULT CALLED TO, READ BACK BY AND VERIFIED WITH: HOLCOMB,R ON 12/31/19 AT 1645 BY LOY,C (NOTE) SARS-CoV-2 target nucleic acids are DETECTED. SARS-CoV-2 RNA is generally detectable in upper respiratory specimens  during the acute phase of infection. Positive results are indicative of the presence of the identified virus, but do not rule out bacterial infection or co-infection with other pathogens not detected by the test. Clinical correlation with patient history and other diagnostic information is necessary to determine patient infection status. The expected result is Negative. Fact Sheet for Patients:  https://www.moore.com/https://www.fda.gov/media/142436/download Fact Sheet for Healthcare Providers: https://www.young.biz/https://www.fda.gov/media/142435/download This test is not yet approved or cleared by the [place] FDA and  has been authorized for detection and/or diagnosis of SARS-CoV-2 by FDA under an Emergency Use Authorization (EUA).  This EUA will remain in effect (meaning this test can be used)  for the duration of  the COVID-19 declaration under Section 564(b)(1) of the Act, 21 U.S.C. section 360bbb-3(b)(1), unless the authorization is terminated or revoked sooner.    Influenza A by PCR NEGATIVE NEGATIVE Final   Influenza B by PCR NEGATIVE NEGATIVE Final    Comment: (NOTE) The Xpert Xpress SARS-CoV-2/FLU/RSV assay is intended as an aid in  the diagnosis of influenza from Nasopharyngeal swab specimens and  should not be used as a sole basis for treatment. Nasal washings and  aspirates are unacceptable for  Xpert Xpress SARS-CoV-2/FLU/RSV  testing. Fact Sheet for Patients: https://www.moore.com/https://www.fda.gov/media/142436/download Fact Sheet for Healthcare Providers: https://www.young.biz/https://www.fda.gov/media/142435/download This test is not yet approved or cleared by the [place] FDA and  has been authorized for detection and/or diagnosis of  SARS-CoV-2 by  FDA under an Emergency Use Authorization (EUA). This EUA will remain  in effect (meaning this test can be used) for the duration of the  Covid-19 declaration under Section 564(b)(1) of the Act, 21  U.S.C. section 360bbb-3(b)(1), unless the authorization is  terminated or revoked. Performed at [facility], [address] [place], [place] [address]   Blood Culture (routine x 2)     Status: None (Preliminary result)   Collection Time: 12/31/19  3:29 PM   Specimen: Right Antecubital; Blood  Result Value Ref Range Status   Specimen Description RIGHT ANTECUBITAL  Final   Special Requests   Final    BOTTLES DRAWN AEROBIC AND ANAEROBIC Blood Culture adequate volume   Culture   Final    NO GROWTH 2 DAYS Performed at [facility], [address] [place], [place] [address]    Report Status PENDING  Incomplete  Blood Culture (routine x 2)     Status: None (Preliminary result)   Collection Time: 12/31/19  3:29 PM   Specimen: BLOOD RIGHT HAND  Result Value Ref Range Status   Specimen Description BLOOD RIGHT HAND  Final   Special Requests   Final    BOTTLES DRAWN AEROBIC ONLY Blood Culture adequate volume   Culture   Final    NO GROWTH 2 DAYS Performed at [facility], [address], [place], [place] [address]    Report Status PENDING  Incomplete  MRSA PCR Screening     Status: None   Collection Time: 12/31/19 11:28 PM   Specimen: Nasal Mucosa; Nasopharyngeal  Result Value Ref Range Status   MRSA by PCR NEGATIVE NEGATIVE Final    Comment:        The GeneXpert MRSA Assay (FDA approved for NASAL specimens only), is one component of a comprehensive MRSA  colonization surveillance program. It is not intended to diagnose MRSA infection nor to guide or monitor treatment for MRSA infections. Performed at [facility], [address], [place], [place] [address]        Today   Subjective    Adin Lariccia today has no complaints -Oral intake remains challenging at times          Patient has been seen and examined prior to discharge   Objective   Blood pressure 131/72, pulse 74, temperature 98.2 F (36.8 C), temperature source Other (Comment), resp. rate 19, height 5\' 7"  (1.702 m), weight 52.9 kg, SpO2 96 %.   Intake/Output Summary (Last 24 hours) at 01/02/2020 1519 Last data filed at 01/02/2020 1300 Gross per 24 hour  Intake 1951.16 ml  Output 952 ml  Net 999.16 ml    Exam Gen:-Not very conversational, in no acute distress  HEENT:- [place].AT, No sclera icterus Neck-Supple Neck,No JVD,.  Lungs-improving air movement, no wheezing CV- S1, S2 normal, regular  Abd-  +ve B.Sounds, Abd Soft, No tenderness, no CVA area tenderness Extremity/Skin:- No  edema, pedal pulses present  Psych-affect is appropriate, underlying/baseline cognitive and memory deficits  neuro-generalized weakness, no new focal deficits, no tremors   Data Review   CBC w Diff:  Lab Results  Component Value Date   WBC 4.1 01/01/2020   HGB 13.2 01/01/2020   HCT 43.1 01/01/2020   PLT 176 01/01/2020   LYMPHOPCT 20 01/01/2020   MONOPCT 10 01/01/2020   EOSPCT 2 01/01/2020   BASOPCT 0 01/01/2020    CMP:  Lab Results  Component Value Date   NA 140 01/02/2020   K 3.8 01/02/2020  CL 109 01/02/2020   CO2 24 01/02/2020   BUN 16 01/02/2020   CREATININE 0.77 01/02/2020   PROT 6.0 (L) 01/01/2020   ALBUMIN 3.0 (L) 01/01/2020   BILITOT 0.5 01/01/2020   ALKPHOS 61 01/01/2020   AST 16 01/01/2020   ALT 11 01/01/2020     Total Discharge time is about 33 minutes  Roxan Hockey M.D on 01/02/2020 at 3:19 PM  Go to [email or web address] -  for contact info  [facility] - Office  [phone]

## 2020-01-02 NOTE — Discharge Instructions (Signed)
1)Avoid ibuprofen/Advil/Aleve/Motrin/Goody Powders/Naproxen/BC powders/Meloxicam/Diclofenac/Indomethacin and other Nonsteroidal anti-inflammatory medications as these will make you more likely to bleed and can cause stomach ulcers, can also cause Kidney problems.   2) You are strongly advised to  to isolate/quarantine for at least 21 days from the date of your diagnoses with COVID-19 infection--please always wear a mask if you have to go outside the house  3) repeat CBC and repeat BMP on Monday, 01/06/2020  4) please give Ensure supplements 1 can twice a day--- patient has poor appetite and poor oral intake so please ,please avoid any restrictions on patient diet patient should be able to eat whatever she wants

## 2020-01-02 NOTE — Plan of Care (Signed)
  Problem: Education: Goal: Knowledge of General Education information will improve Description: Including pain rating scale, medication(s)/side effects and non-pharmacologic comfort measures 01/02/2020 1500 by Sheela Stack, RN Outcome: Not Progressing 01/02/2020 1459 by Sheela Stack, RN Outcome: Progressing   Problem: Health Behavior/Discharge Planning: Goal: Ability to manage health-related needs will improve 01/02/2020 1500 by Sheela Stack, RN Outcome: Not Progressing 01/02/2020 1459 by Sheela Stack, RN Outcome: Progressing   Problem: Clinical Measurements: Goal: Ability to maintain clinical measurements within normal limits will improve 01/02/2020 1500 by Sheela Stack, RN Outcome: Progressing 01/02/2020 1459 by Sheela Stack, RN Outcome: Progressing Goal: Will remain free from infection 01/02/2020 1500 by Sheela Stack, RN Outcome: Progressing 01/02/2020 1459 by Sheela Stack, RN Outcome: Progressing Goal: Diagnostic test results will improve 01/02/2020 1500 by Sheela Stack, RN Outcome: Progressing 01/02/2020 1459 by Sheela Stack, RN Outcome: Progressing Goal: Respiratory complications will improve 01/02/2020 1500 by Sheela Stack, RN Outcome: Progressing 01/02/2020 1459 by Sheela Stack, RN Outcome: Progressing Goal: Cardiovascular complication will be avoided 01/02/2020 1500 by Sheela Stack, RN Outcome: Progressing 01/02/2020 1459 by Sheela Stack, RN Outcome: Progressing   Problem: Activity: Goal: Risk for activity intolerance will decrease 01/02/2020 1500 by Sheela Stack, RN Outcome: Progressing 01/02/2020 1459 by Sheela Stack, RN Outcome: Progressing   Problem: Nutrition: Goal: Adequate nutrition will be maintained 01/02/2020 1500 by Sheela Stack, RN Outcome: Not Progressing 01/02/2020 1459 by Sheela Stack, RN Outcome: Progressing   Problem: Coping: Goal: Level of anxiety will decrease 01/02/2020 1500 by Sheela Stack,  RN Outcome: Progressing 01/02/2020 1459 by Sheela Stack, RN Outcome: Progressing   Problem: Elimination: Goal: Will not experience complications related to bowel motility 01/02/2020 1500 by Sheela Stack, RN Outcome: Progressing 01/02/2020 1459 by Sheela Stack, RN Outcome: Progressing Goal: Will not experience complications related to urinary retention 01/02/2020 1500 by Sheela Stack, RN Outcome: Progressing 01/02/2020 1459 by Sheela Stack, RN Outcome: Progressing   Problem: Pain Managment: Goal: General experience of comfort will improve 01/02/2020 1500 by Sheela Stack, RN Outcome: Progressing 01/02/2020 1459 by Sheela Stack, RN Outcome: Progressing   Problem: Safety: Goal: Ability to remain free from injury will improve 01/02/2020 1500 by Sheela Stack, RN Outcome: Progressing 01/02/2020 1459 by Sheela Stack, RN Outcome: Progressing   Problem: Skin Integrity: Goal: Risk for impaired skin integrity will decrease 01/02/2020 1500 by Sheela Stack, RN Outcome: Progressing 01/02/2020 1459 by Sheela Stack, RN Outcome: Progressing

## 2020-01-02 NOTE — Clinical Social Work Note (Signed)
Doug at [facility] advised of discharge. Discharge clinicals sent. Patient's niece notified by attending of discharge. RN to call report. EMS called for transport.    TOC signing off.     Quisha Mabie, Juleen China, LCSW

## 2020-01-02 NOTE — Plan of Care (Signed)
Problem: Education: Goal: Knowledge of General Education information will improve Description: Including pain rating scale, medication(s)/side effects and non-pharmacologic comfort measures 01/02/2020 1615 by Melony Overly, RN Outcome: Adequate for Discharge 01/02/2020 1500 by Melony Overly, RN Outcome: Not Progressing 01/02/2020 1459 by Melony Overly, RN Outcome: Progressing   Problem: Health Behavior/Discharge Planning: Goal: Ability to manage health-related needs will improve 01/02/2020 1615 by Melony Overly, RN Outcome: Adequate for Discharge 01/02/2020 1500 by Melony Overly, RN Outcome: Not Progressing 01/02/2020 1459 by Melony Overly, RN Outcome: Progressing   Problem: Clinical Measurements: Goal: Ability to maintain clinical measurements within normal limits will improve 01/02/2020 1615 by Melony Overly, RN Outcome: Adequate for Discharge 01/02/2020 1500 by Melony Overly, RN Outcome: Progressing 01/02/2020 1459 by Melony Overly, RN Outcome: Progressing Goal: Will remain free from infection 01/02/2020 1615 by Melony Overly, RN Outcome: Adequate for Discharge 01/02/2020 1500 by Melony Overly, RN Outcome: Progressing 01/02/2020 1459 by Melony Overly, RN Outcome: Progressing Goal: Diagnostic test results will improve 01/02/2020 1615 by Melony Overly, RN Outcome: Adequate for Discharge 01/02/2020 1500 by Melony Overly, RN Outcome: Progressing 01/02/2020 1459 by Melony Overly, RN Outcome: Progressing Goal: Respiratory complications will improve 01/02/2020 1615 by Melony Overly, RN Outcome: Adequate for Discharge 01/02/2020 1500 by Melony Overly, RN Outcome: Progressing 01/02/2020 1459 by Melony Overly, RN Outcome: Progressing Goal: Cardiovascular complication will be avoided 01/02/2020 1615 by Melony Overly, RN Outcome: Adequate for Discharge 01/02/2020 1500 by Melony Overly, RN Outcome: Progressing 01/02/2020 1459 by Melony Overly,  RN Outcome: Progressing   Problem: Activity: Goal: Risk for activity intolerance will decrease 01/02/2020 1615 by Melony Overly, RN Outcome: Adequate for Discharge 01/02/2020 1500 by Melony Overly, RN Outcome: Progressing 01/02/2020 1459 by Melony Overly, RN Outcome: Progressing   Problem: Nutrition: Goal: Adequate nutrition will be maintained 01/02/2020 1615 by Melony Overly, RN Outcome: Adequate for Discharge 01/02/2020 1500 by Melony Overly, RN Outcome: Not Progressing 01/02/2020 1459 by Melony Overly, RN Outcome: Progressing   Problem: Coping: Goal: Level of anxiety will decrease 01/02/2020 1615 by Melony Overly, RN Outcome: Adequate for Discharge 01/02/2020 1500 by Melony Overly, RN Outcome: Progressing 01/02/2020 1459 by Melony Overly, RN Outcome: Progressing   Problem: Elimination: Goal: Will not experience complications related to bowel motility 01/02/2020 1615 by Melony Overly, RN Outcome: Adequate for Discharge 01/02/2020 1500 by Melony Overly, RN Outcome: Progressing 01/02/2020 1459 by Melony Overly, RN Outcome: Progressing Goal: Will not experience complications related to urinary retention 01/02/2020 1615 by Melony Overly, RN Outcome: Adequate for Discharge 01/02/2020 1500 by Melony Overly, RN Outcome: Progressing 01/02/2020 1459 by Melony Overly, RN Outcome: Progressing   Problem: Pain Managment: Goal: General experience of comfort will improve 01/02/2020 1615 by Melony Overly, RN Outcome: Adequate for Discharge 01/02/2020 1500 by Melony Overly, RN Outcome: Progressing 01/02/2020 1459 by Melony Overly, RN Outcome: Progressing   Problem: Safety: Goal: Ability to remain free from injury will improve 01/02/2020 1615 by Melony Overly, RN Outcome: Adequate for Discharge 01/02/2020 1500 by Melony Overly, RN Outcome: Progressing 01/02/2020 1459 by Melony Overly, RN Outcome: Progressing   Problem: Skin Integrity: Goal: Risk  for impaired skin integrity will decrease 01/02/2020 1615 by Melony Overly, RN Outcome: Adequate for Discharge 01/02/2020 1500 by Melony Overly, RN Outcome: Progressing 01/02/2020 1459 by Joya Gaskins,  Isaiah Blakes, RN Outcome: Progressing

## 2020-01-05 LAB — CULTURE, BLOOD (ROUTINE X 2)
Culture: NO GROWTH
Culture: NO GROWTH
Special Requests: ADEQUATE
Special Requests: ADEQUATE

## 2020-01-07 DIAGNOSIS — S72142A Displaced intertrochanteric fracture of left femur, initial encounter for closed fracture: Secondary | ICD-10-CM | POA: Diagnosis not present

## 2020-01-07 DIAGNOSIS — E039 Hypothyroidism, unspecified: Secondary | ICD-10-CM | POA: Diagnosis not present

## 2020-01-07 DIAGNOSIS — I10 Essential (primary) hypertension: Secondary | ICD-10-CM | POA: Diagnosis not present

## 2020-01-07 DIAGNOSIS — Y92099 Unspecified place in other non-institutional residence as the place of occurrence of the external cause: Secondary | ICD-10-CM | POA: Diagnosis not present

## 2020-01-07 DIAGNOSIS — K219 Gastro-esophageal reflux disease without esophagitis: Secondary | ICD-10-CM | POA: Diagnosis not present

## 2020-01-07 DIAGNOSIS — U071 COVID-19: Secondary | ICD-10-CM | POA: Diagnosis not present

## 2020-01-07 DIAGNOSIS — R404 Transient alteration of awareness: Secondary | ICD-10-CM | POA: Diagnosis not present

## 2020-01-07 DIAGNOSIS — R5381 Other malaise: Secondary | ICD-10-CM | POA: Diagnosis not present

## 2020-01-07 DIAGNOSIS — I959 Hypotension, unspecified: Secondary | ICD-10-CM | POA: Diagnosis not present

## 2020-01-07 DIAGNOSIS — Z885 Allergy status to narcotic agent status: Secondary | ICD-10-CM | POA: Diagnosis not present

## 2020-01-07 DIAGNOSIS — W19XXXA Unspecified fall, initial encounter: Secondary | ICD-10-CM | POA: Diagnosis not present

## 2020-01-07 DIAGNOSIS — R000 Tachycardia, unspecified: Secondary | ICD-10-CM | POA: Diagnosis not present

## 2020-01-07 DIAGNOSIS — S72412A Displaced unspecified condyle fracture of lower end of left femur, initial encounter for closed fracture: Secondary | ICD-10-CM | POA: Diagnosis not present

## 2020-01-07 DIAGNOSIS — F0390 Unspecified dementia without behavioral disturbance: Secondary | ICD-10-CM | POA: Diagnosis not present

## 2020-01-08 DIAGNOSIS — F0390 Unspecified dementia without behavioral disturbance: Secondary | ICD-10-CM | POA: Diagnosis not present

## 2020-01-08 DIAGNOSIS — S72142A Displaced intertrochanteric fracture of left femur, initial encounter for closed fracture: Secondary | ICD-10-CM | POA: Diagnosis not present

## 2020-01-08 DIAGNOSIS — Z885 Allergy status to narcotic agent status: Secondary | ICD-10-CM | POA: Diagnosis not present

## 2020-01-08 DIAGNOSIS — W19XXXA Unspecified fall, initial encounter: Secondary | ICD-10-CM | POA: Diagnosis not present

## 2020-01-08 DIAGNOSIS — U071 COVID-19: Secondary | ICD-10-CM | POA: Diagnosis not present

## 2020-01-08 DIAGNOSIS — Y92099 Unspecified place in other non-institutional residence as the place of occurrence of the external cause: Secondary | ICD-10-CM | POA: Diagnosis not present

## 2020-01-09 DIAGNOSIS — I739 Peripheral vascular disease, unspecified: Secondary | ICD-10-CM | POA: Diagnosis not present

## 2020-01-09 DIAGNOSIS — N179 Acute kidney failure, unspecified: Secondary | ICD-10-CM | POA: Diagnosis not present

## 2020-01-09 DIAGNOSIS — G9340 Encephalopathy, unspecified: Secondary | ICD-10-CM | POA: Diagnosis not present

## 2020-01-09 DIAGNOSIS — Z7401 Bed confinement status: Secondary | ICD-10-CM | POA: Diagnosis not present

## 2020-01-09 DIAGNOSIS — F0391 Unspecified dementia with behavioral disturbance: Secondary | ICD-10-CM | POA: Diagnosis not present

## 2020-01-09 DIAGNOSIS — M545 Low back pain: Secondary | ICD-10-CM | POA: Diagnosis not present

## 2020-01-09 DIAGNOSIS — F0281 Dementia in other diseases classified elsewhere with behavioral disturbance: Secondary | ICD-10-CM | POA: Diagnosis not present

## 2020-01-09 DIAGNOSIS — S72002A Fracture of unspecified part of neck of left femur, initial encounter for closed fracture: Secondary | ICD-10-CM | POA: Diagnosis not present

## 2020-01-09 DIAGNOSIS — F0390 Unspecified dementia without behavioral disturbance: Secondary | ICD-10-CM | POA: Diagnosis not present

## 2020-01-09 DIAGNOSIS — Z885 Allergy status to narcotic agent status: Secondary | ICD-10-CM | POA: Diagnosis not present

## 2020-01-09 DIAGNOSIS — K5750 Diverticulosis of both small and large intestine without perforation or abscess without bleeding: Secondary | ICD-10-CM | POA: Diagnosis not present

## 2020-01-09 DIAGNOSIS — M818 Other osteoporosis without current pathological fracture: Secondary | ICD-10-CM | POA: Diagnosis not present

## 2020-01-09 DIAGNOSIS — N390 Urinary tract infection, site not specified: Secondary | ICD-10-CM | POA: Diagnosis not present

## 2020-01-09 DIAGNOSIS — R52 Pain, unspecified: Secondary | ICD-10-CM | POA: Diagnosis not present

## 2020-01-09 DIAGNOSIS — Z9181 History of falling: Secondary | ICD-10-CM | POA: Diagnosis not present

## 2020-01-09 DIAGNOSIS — R5082 Postprocedural fever: Secondary | ICD-10-CM | POA: Diagnosis not present

## 2020-01-09 DIAGNOSIS — L89152 Pressure ulcer of sacral region, stage 2: Secondary | ICD-10-CM | POA: Diagnosis present

## 2020-01-09 DIAGNOSIS — I517 Cardiomegaly: Secondary | ICD-10-CM | POA: Diagnosis not present

## 2020-01-09 DIAGNOSIS — F0280 Dementia in other diseases classified elsewhere without behavioral disturbance: Secondary | ICD-10-CM | POA: Diagnosis present

## 2020-01-09 DIAGNOSIS — Z87891 Personal history of nicotine dependence: Secondary | ICD-10-CM | POA: Diagnosis not present

## 2020-01-09 DIAGNOSIS — J9621 Acute and chronic respiratory failure with hypoxia: Secondary | ICD-10-CM | POA: Diagnosis not present

## 2020-01-09 DIAGNOSIS — G309 Alzheimer's disease, unspecified: Secondary | ICD-10-CM | POA: Diagnosis present

## 2020-01-09 DIAGNOSIS — W19XXXA Unspecified fall, initial encounter: Secondary | ICD-10-CM | POA: Diagnosis not present

## 2020-01-09 DIAGNOSIS — E039 Hypothyroidism, unspecified: Secondary | ICD-10-CM | POA: Diagnosis not present

## 2020-01-09 DIAGNOSIS — R1312 Dysphagia, oropharyngeal phase: Secondary | ICD-10-CM | POA: Diagnosis not present

## 2020-01-09 DIAGNOSIS — R262 Difficulty in walking, not elsewhere classified: Secondary | ICD-10-CM | POA: Diagnosis not present

## 2020-01-09 DIAGNOSIS — E876 Hypokalemia: Secondary | ICD-10-CM | POA: Diagnosis not present

## 2020-01-09 DIAGNOSIS — R509 Fever, unspecified: Secondary | ICD-10-CM | POA: Diagnosis not present

## 2020-01-09 DIAGNOSIS — S72142A Displaced intertrochanteric fracture of left femur, initial encounter for closed fracture: Secondary | ICD-10-CM | POA: Diagnosis present

## 2020-01-09 DIAGNOSIS — S72141A Displaced intertrochanteric fracture of right femur, initial encounter for closed fracture: Secondary | ICD-10-CM | POA: Diagnosis not present

## 2020-01-09 DIAGNOSIS — E870 Hyperosmolality and hypernatremia: Secondary | ICD-10-CM | POA: Diagnosis not present

## 2020-01-09 DIAGNOSIS — Z66 Do not resuscitate: Secondary | ICD-10-CM | POA: Diagnosis present

## 2020-01-09 DIAGNOSIS — R918 Other nonspecific abnormal finding of lung field: Secondary | ICD-10-CM | POA: Diagnosis not present

## 2020-01-09 DIAGNOSIS — S72142S Displaced intertrochanteric fracture of left femur, sequela: Secondary | ICD-10-CM | POA: Diagnosis not present

## 2020-01-09 DIAGNOSIS — R000 Tachycardia, unspecified: Secondary | ICD-10-CM | POA: Diagnosis not present

## 2020-01-09 DIAGNOSIS — E860 Dehydration: Secondary | ICD-10-CM | POA: Diagnosis not present

## 2020-01-09 DIAGNOSIS — D62 Acute posthemorrhagic anemia: Secondary | ICD-10-CM | POA: Diagnosis not present

## 2020-01-09 DIAGNOSIS — M6281 Muscle weakness (generalized): Secondary | ICD-10-CM | POA: Diagnosis not present

## 2020-01-09 DIAGNOSIS — Z515 Encounter for palliative care: Secondary | ICD-10-CM | POA: Diagnosis not present

## 2020-01-09 DIAGNOSIS — E46 Unspecified protein-calorie malnutrition: Secondary | ICD-10-CM | POA: Diagnosis not present

## 2020-01-09 DIAGNOSIS — R8281 Pyuria: Secondary | ICD-10-CM | POA: Diagnosis not present

## 2020-01-09 DIAGNOSIS — Z741 Need for assistance with personal care: Secondary | ICD-10-CM | POA: Diagnosis not present

## 2020-01-09 DIAGNOSIS — K219 Gastro-esophageal reflux disease without esophagitis: Secondary | ICD-10-CM | POA: Diagnosis not present

## 2020-01-09 DIAGNOSIS — E785 Hyperlipidemia, unspecified: Secondary | ICD-10-CM | POA: Diagnosis not present

## 2020-01-09 DIAGNOSIS — Z9013 Acquired absence of bilateral breasts and nipples: Secondary | ICD-10-CM | POA: Diagnosis not present

## 2020-01-09 DIAGNOSIS — S2231XS Fracture of one rib, right side, sequela: Secondary | ICD-10-CM | POA: Diagnosis not present

## 2020-01-09 DIAGNOSIS — U071 COVID-19: Secondary | ICD-10-CM | POA: Diagnosis present

## 2020-01-09 DIAGNOSIS — I10 Essential (primary) hypertension: Secondary | ICD-10-CM | POA: Diagnosis present

## 2020-01-09 DIAGNOSIS — R4182 Altered mental status, unspecified: Secondary | ICD-10-CM | POA: Diagnosis not present

## 2020-01-09 DIAGNOSIS — W1839XA Other fall on same level, initial encounter: Secondary | ICD-10-CM | POA: Diagnosis not present

## 2020-01-10 DIAGNOSIS — S72141A Displaced intertrochanteric fracture of right femur, initial encounter for closed fracture: Secondary | ICD-10-CM | POA: Diagnosis not present

## 2020-01-10 DIAGNOSIS — W1839XA Other fall on same level, initial encounter: Secondary | ICD-10-CM | POA: Diagnosis not present

## 2020-01-10 DIAGNOSIS — S72142A Displaced intertrochanteric fracture of left femur, initial encounter for closed fracture: Secondary | ICD-10-CM | POA: Diagnosis not present

## 2020-01-10 DIAGNOSIS — U071 COVID-19: Secondary | ICD-10-CM | POA: Diagnosis not present

## 2020-01-10 DIAGNOSIS — I10 Essential (primary) hypertension: Secondary | ICD-10-CM | POA: Diagnosis not present

## 2020-01-10 DIAGNOSIS — F0280 Dementia in other diseases classified elsewhere without behavioral disturbance: Secondary | ICD-10-CM | POA: Diagnosis not present

## 2020-01-10 DIAGNOSIS — J9621 Acute and chronic respiratory failure with hypoxia: Secondary | ICD-10-CM | POA: Diagnosis not present

## 2020-01-10 DIAGNOSIS — L89152 Pressure ulcer of sacral region, stage 2: Secondary | ICD-10-CM | POA: Diagnosis not present

## 2020-01-10 DIAGNOSIS — G309 Alzheimer's disease, unspecified: Secondary | ICD-10-CM | POA: Diagnosis not present

## 2020-01-10 DIAGNOSIS — S72002A Fracture of unspecified part of neck of left femur, initial encounter for closed fracture: Secondary | ICD-10-CM | POA: Diagnosis not present

## 2020-01-10 DIAGNOSIS — W19XXXA Unspecified fall, initial encounter: Secondary | ICD-10-CM | POA: Diagnosis not present

## 2020-01-10 DIAGNOSIS — R1312 Dysphagia, oropharyngeal phase: Secondary | ICD-10-CM | POA: Diagnosis not present

## 2020-01-11 DIAGNOSIS — R509 Fever, unspecified: Secondary | ICD-10-CM | POA: Diagnosis not present

## 2020-01-11 DIAGNOSIS — S72142A Displaced intertrochanteric fracture of left femur, initial encounter for closed fracture: Secondary | ICD-10-CM | POA: Diagnosis not present

## 2020-01-11 DIAGNOSIS — G309 Alzheimer's disease, unspecified: Secondary | ICD-10-CM | POA: Diagnosis not present

## 2020-01-11 DIAGNOSIS — D62 Acute posthemorrhagic anemia: Secondary | ICD-10-CM | POA: Diagnosis not present

## 2020-01-11 DIAGNOSIS — R8281 Pyuria: Secondary | ICD-10-CM | POA: Diagnosis not present

## 2020-01-11 DIAGNOSIS — U071 COVID-19: Secondary | ICD-10-CM | POA: Diagnosis not present

## 2020-01-11 DIAGNOSIS — F0281 Dementia in other diseases classified elsewhere with behavioral disturbance: Secondary | ICD-10-CM | POA: Diagnosis not present

## 2020-01-11 DIAGNOSIS — W1839XA Other fall on same level, initial encounter: Secondary | ICD-10-CM | POA: Diagnosis not present

## 2020-01-11 DIAGNOSIS — R000 Tachycardia, unspecified: Secondary | ICD-10-CM | POA: Diagnosis not present

## 2020-01-12 DIAGNOSIS — R8281 Pyuria: Secondary | ICD-10-CM | POA: Diagnosis not present

## 2020-01-12 DIAGNOSIS — U071 COVID-19: Secondary | ICD-10-CM | POA: Diagnosis not present

## 2020-01-12 DIAGNOSIS — F0281 Dementia in other diseases classified elsewhere with behavioral disturbance: Secondary | ICD-10-CM | POA: Diagnosis not present

## 2020-01-12 DIAGNOSIS — S72142A Displaced intertrochanteric fracture of left femur, initial encounter for closed fracture: Secondary | ICD-10-CM | POA: Diagnosis not present

## 2020-01-12 DIAGNOSIS — W1839XA Other fall on same level, initial encounter: Secondary | ICD-10-CM | POA: Diagnosis not present

## 2020-01-12 DIAGNOSIS — D62 Acute posthemorrhagic anemia: Secondary | ICD-10-CM | POA: Diagnosis not present

## 2020-01-12 DIAGNOSIS — R000 Tachycardia, unspecified: Secondary | ICD-10-CM | POA: Diagnosis not present

## 2020-01-12 DIAGNOSIS — G309 Alzheimer's disease, unspecified: Secondary | ICD-10-CM | POA: Diagnosis not present

## 2020-01-13 DIAGNOSIS — G309 Alzheimer's disease, unspecified: Secondary | ICD-10-CM | POA: Diagnosis not present

## 2020-01-13 DIAGNOSIS — R000 Tachycardia, unspecified: Secondary | ICD-10-CM | POA: Diagnosis not present

## 2020-01-13 DIAGNOSIS — D62 Acute posthemorrhagic anemia: Secondary | ICD-10-CM | POA: Diagnosis not present

## 2020-01-13 DIAGNOSIS — S72002A Fracture of unspecified part of neck of left femur, initial encounter for closed fracture: Secondary | ICD-10-CM | POA: Diagnosis not present

## 2020-01-13 DIAGNOSIS — F0280 Dementia in other diseases classified elsewhere without behavioral disturbance: Secondary | ICD-10-CM | POA: Diagnosis not present

## 2020-01-13 DIAGNOSIS — U071 COVID-19: Secondary | ICD-10-CM | POA: Diagnosis not present

## 2020-01-13 DIAGNOSIS — R509 Fever, unspecified: Secondary | ICD-10-CM | POA: Diagnosis not present

## 2020-01-13 DIAGNOSIS — F0281 Dementia in other diseases classified elsewhere with behavioral disturbance: Secondary | ICD-10-CM | POA: Diagnosis not present

## 2020-01-13 DIAGNOSIS — R8281 Pyuria: Secondary | ICD-10-CM | POA: Diagnosis not present

## 2020-01-13 DIAGNOSIS — W1839XA Other fall on same level, initial encounter: Secondary | ICD-10-CM | POA: Diagnosis not present

## 2020-01-13 DIAGNOSIS — S72142A Displaced intertrochanteric fracture of left femur, initial encounter for closed fracture: Secondary | ICD-10-CM | POA: Diagnosis not present

## 2020-01-14 DIAGNOSIS — F0281 Dementia in other diseases classified elsewhere with behavioral disturbance: Secondary | ICD-10-CM | POA: Diagnosis not present

## 2020-01-14 DIAGNOSIS — R8281 Pyuria: Secondary | ICD-10-CM | POA: Diagnosis not present

## 2020-01-14 DIAGNOSIS — S72142A Displaced intertrochanteric fracture of left femur, initial encounter for closed fracture: Secondary | ICD-10-CM | POA: Diagnosis not present

## 2020-01-14 DIAGNOSIS — D62 Acute posthemorrhagic anemia: Secondary | ICD-10-CM | POA: Diagnosis not present

## 2020-01-14 DIAGNOSIS — R509 Fever, unspecified: Secondary | ICD-10-CM | POA: Diagnosis not present

## 2020-01-14 DIAGNOSIS — J9621 Acute and chronic respiratory failure with hypoxia: Secondary | ICD-10-CM | POA: Diagnosis not present

## 2020-01-14 DIAGNOSIS — G309 Alzheimer's disease, unspecified: Secondary | ICD-10-CM | POA: Diagnosis not present

## 2020-01-14 DIAGNOSIS — W1839XA Other fall on same level, initial encounter: Secondary | ICD-10-CM | POA: Diagnosis not present

## 2020-01-14 DIAGNOSIS — U071 COVID-19: Secondary | ICD-10-CM | POA: Diagnosis not present

## 2020-01-14 DIAGNOSIS — R918 Other nonspecific abnormal finding of lung field: Secondary | ICD-10-CM | POA: Diagnosis not present

## 2020-01-14 DIAGNOSIS — F0280 Dementia in other diseases classified elsewhere without behavioral disturbance: Secondary | ICD-10-CM | POA: Diagnosis not present

## 2020-01-14 DIAGNOSIS — S72002A Fracture of unspecified part of neck of left femur, initial encounter for closed fracture: Secondary | ICD-10-CM | POA: Diagnosis not present

## 2020-01-14 DIAGNOSIS — R000 Tachycardia, unspecified: Secondary | ICD-10-CM | POA: Diagnosis not present

## 2020-01-15 DIAGNOSIS — S72002A Fracture of unspecified part of neck of left femur, initial encounter for closed fracture: Secondary | ICD-10-CM | POA: Diagnosis not present

## 2020-01-15 DIAGNOSIS — D62 Acute posthemorrhagic anemia: Secondary | ICD-10-CM | POA: Diagnosis not present

## 2020-01-15 DIAGNOSIS — G309 Alzheimer's disease, unspecified: Secondary | ICD-10-CM | POA: Diagnosis not present

## 2020-01-15 DIAGNOSIS — R509 Fever, unspecified: Secondary | ICD-10-CM | POA: Diagnosis not present

## 2020-01-15 DIAGNOSIS — R8281 Pyuria: Secondary | ICD-10-CM | POA: Diagnosis not present

## 2020-01-15 DIAGNOSIS — U071 COVID-19: Secondary | ICD-10-CM | POA: Diagnosis not present

## 2020-01-15 DIAGNOSIS — W1839XA Other fall on same level, initial encounter: Secondary | ICD-10-CM | POA: Diagnosis not present

## 2020-01-15 DIAGNOSIS — F0280 Dementia in other diseases classified elsewhere without behavioral disturbance: Secondary | ICD-10-CM | POA: Diagnosis not present

## 2020-01-15 DIAGNOSIS — R000 Tachycardia, unspecified: Secondary | ICD-10-CM | POA: Diagnosis not present

## 2020-01-15 DIAGNOSIS — F0281 Dementia in other diseases classified elsewhere with behavioral disturbance: Secondary | ICD-10-CM | POA: Diagnosis not present

## 2020-01-15 DIAGNOSIS — S72142A Displaced intertrochanteric fracture of left femur, initial encounter for closed fracture: Secondary | ICD-10-CM | POA: Diagnosis not present

## 2020-01-16 DIAGNOSIS — R52 Pain, unspecified: Secondary | ICD-10-CM | POA: Diagnosis not present

## 2020-01-16 DIAGNOSIS — R0600 Dyspnea, unspecified: Secondary | ICD-10-CM | POA: Diagnosis not present

## 2020-01-16 DIAGNOSIS — Z7401 Bed confinement status: Secondary | ICD-10-CM | POA: Diagnosis not present

## 2020-01-16 DIAGNOSIS — R69 Illness, unspecified: Secondary | ICD-10-CM | POA: Diagnosis not present

## 2020-01-16 DIAGNOSIS — M25552 Pain in left hip: Secondary | ICD-10-CM | POA: Diagnosis not present

## 2020-01-16 DIAGNOSIS — Z9181 History of falling: Secondary | ICD-10-CM | POA: Diagnosis not present

## 2020-01-16 DIAGNOSIS — F0390 Unspecified dementia without behavioral disturbance: Secondary | ICD-10-CM | POA: Diagnosis not present

## 2020-01-16 DIAGNOSIS — M818 Other osteoporosis without current pathological fracture: Secondary | ICD-10-CM | POA: Diagnosis not present

## 2020-01-16 DIAGNOSIS — E039 Hypothyroidism, unspecified: Secondary | ICD-10-CM | POA: Diagnosis not present

## 2020-01-16 DIAGNOSIS — N179 Acute kidney failure, unspecified: Secondary | ICD-10-CM | POA: Diagnosis not present

## 2020-01-16 DIAGNOSIS — E860 Dehydration: Secondary | ICD-10-CM | POA: Diagnosis not present

## 2020-01-16 DIAGNOSIS — G309 Alzheimer's disease, unspecified: Secondary | ICD-10-CM | POA: Diagnosis not present

## 2020-01-16 DIAGNOSIS — J9621 Acute and chronic respiratory failure with hypoxia: Secondary | ICD-10-CM | POA: Diagnosis not present

## 2020-01-16 DIAGNOSIS — R262 Difficulty in walking, not elsewhere classified: Secondary | ICD-10-CM | POA: Diagnosis not present

## 2020-01-16 DIAGNOSIS — I10 Essential (primary) hypertension: Secondary | ICD-10-CM | POA: Diagnosis not present

## 2020-01-16 DIAGNOSIS — R627 Adult failure to thrive: Secondary | ICD-10-CM | POA: Diagnosis not present

## 2020-01-16 DIAGNOSIS — S2231XS Fracture of one rib, right side, sequela: Secondary | ICD-10-CM | POA: Diagnosis not present

## 2020-01-16 DIAGNOSIS — R509 Fever, unspecified: Secondary | ICD-10-CM | POA: Diagnosis not present

## 2020-01-16 DIAGNOSIS — M6281 Muscle weakness (generalized): Secondary | ICD-10-CM | POA: Diagnosis not present

## 2020-01-16 DIAGNOSIS — S72002A Fracture of unspecified part of neck of left femur, initial encounter for closed fracture: Secondary | ICD-10-CM | POA: Diagnosis not present

## 2020-01-16 DIAGNOSIS — R0602 Shortness of breath: Secondary | ICD-10-CM | POA: Diagnosis not present

## 2020-01-16 DIAGNOSIS — Z885 Allergy status to narcotic agent status: Secondary | ICD-10-CM | POA: Diagnosis not present

## 2020-01-16 DIAGNOSIS — S72142S Displaced intertrochanteric fracture of left femur, sequela: Secondary | ICD-10-CM | POA: Diagnosis not present

## 2020-01-16 DIAGNOSIS — M545 Low back pain: Secondary | ICD-10-CM | POA: Diagnosis not present

## 2020-01-16 DIAGNOSIS — Z23 Encounter for immunization: Secondary | ICD-10-CM | POA: Diagnosis not present

## 2020-01-16 DIAGNOSIS — I739 Peripheral vascular disease, unspecified: Secondary | ICD-10-CM | POA: Diagnosis not present

## 2020-01-16 DIAGNOSIS — K219 Gastro-esophageal reflux disease without esophagitis: Secondary | ICD-10-CM | POA: Diagnosis not present

## 2020-01-16 DIAGNOSIS — N390 Urinary tract infection, site not specified: Secondary | ICD-10-CM | POA: Diagnosis not present

## 2020-01-16 DIAGNOSIS — F0280 Dementia in other diseases classified elsewhere without behavioral disturbance: Secondary | ICD-10-CM | POA: Diagnosis not present

## 2020-01-16 DIAGNOSIS — R4182 Altered mental status, unspecified: Secondary | ICD-10-CM | POA: Diagnosis not present

## 2020-01-16 DIAGNOSIS — F0391 Unspecified dementia with behavioral disturbance: Secondary | ICD-10-CM | POA: Diagnosis not present

## 2020-01-16 DIAGNOSIS — G9340 Encephalopathy, unspecified: Secondary | ICD-10-CM | POA: Diagnosis not present

## 2020-01-16 DIAGNOSIS — Z741 Need for assistance with personal care: Secondary | ICD-10-CM | POA: Diagnosis not present

## 2020-01-16 DIAGNOSIS — R5381 Other malaise: Secondary | ICD-10-CM | POA: Diagnosis not present

## 2020-01-16 DIAGNOSIS — E46 Unspecified protein-calorie malnutrition: Secondary | ICD-10-CM | POA: Diagnosis not present

## 2020-01-16 DIAGNOSIS — R1312 Dysphagia, oropharyngeal phase: Secondary | ICD-10-CM | POA: Diagnosis not present

## 2020-01-16 DIAGNOSIS — E785 Hyperlipidemia, unspecified: Secondary | ICD-10-CM | POA: Diagnosis not present

## 2020-01-16 DIAGNOSIS — K5750 Diverticulosis of both small and large intestine without perforation or abscess without bleeding: Secondary | ICD-10-CM | POA: Diagnosis not present

## 2020-01-16 DIAGNOSIS — U071 COVID-19: Secondary | ICD-10-CM | POA: Diagnosis not present

## 2020-01-17 DIAGNOSIS — M25552 Pain in left hip: Secondary | ICD-10-CM | POA: Diagnosis not present

## 2020-01-17 DIAGNOSIS — S72002A Fracture of unspecified part of neck of left femur, initial encounter for closed fracture: Secondary | ICD-10-CM | POA: Diagnosis not present

## 2020-01-17 DIAGNOSIS — R627 Adult failure to thrive: Secondary | ICD-10-CM | POA: Diagnosis not present

## 2020-01-17 DIAGNOSIS — G309 Alzheimer's disease, unspecified: Secondary | ICD-10-CM | POA: Diagnosis not present

## 2020-01-17 DIAGNOSIS — R5381 Other malaise: Secondary | ICD-10-CM | POA: Diagnosis not present

## 2020-01-17 DIAGNOSIS — Z885 Allergy status to narcotic agent status: Secondary | ICD-10-CM | POA: Diagnosis not present

## 2020-01-17 DIAGNOSIS — R52 Pain, unspecified: Secondary | ICD-10-CM | POA: Diagnosis not present

## 2020-01-17 DIAGNOSIS — F0280 Dementia in other diseases classified elsewhere without behavioral disturbance: Secondary | ICD-10-CM | POA: Diagnosis not present

## 2020-01-17 DIAGNOSIS — R0602 Shortness of breath: Secondary | ICD-10-CM | POA: Diagnosis not present

## 2020-01-17 DIAGNOSIS — U071 COVID-19: Secondary | ICD-10-CM | POA: Diagnosis not present

## 2020-01-17 DIAGNOSIS — F0390 Unspecified dementia without behavioral disturbance: Secondary | ICD-10-CM | POA: Diagnosis not present

## 2020-01-20 DIAGNOSIS — Z23 Encounter for immunization: Secondary | ICD-10-CM | POA: Diagnosis not present

## 2020-01-25 IMAGING — CT CT HEAD W/O CM
3 series · 16 of 47 positions shown, 19 images · non-contrast
Comparison: September 29, 2019.

CLINICAL DATA: Altered mental status.

EXAM:
CT HEAD WITHOUT CONTRAST
TECHNIQUE: Contiguous axial images were obtained from the base of the skull
through the vertex without intravenous contrast.

[Series 2: head w o · axial · 0.44mm/px · z∈[+8,+143]mm · 10 of 33 slices shown, 13 images]
[im 3/33  brain]
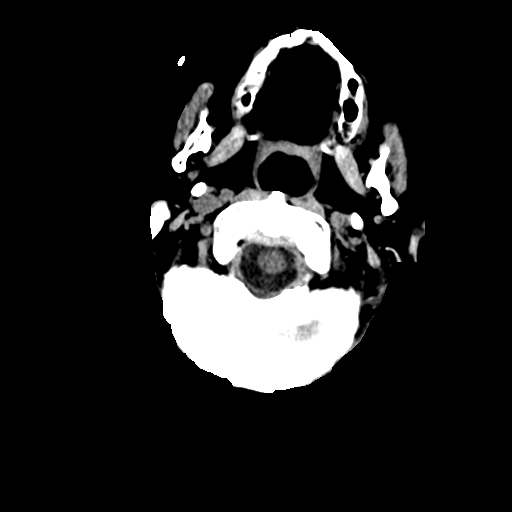
[im 3/33  bone]
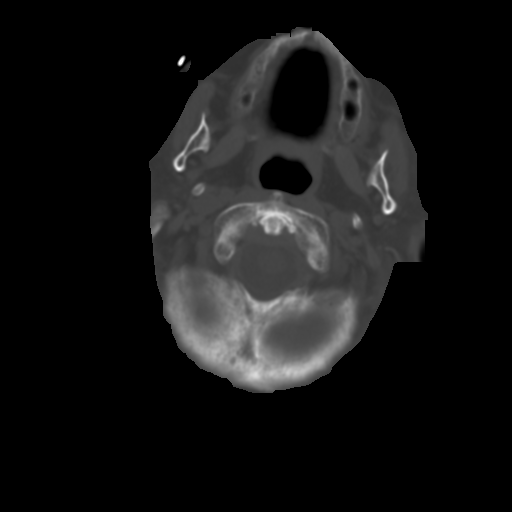
[im 6/33  brain]
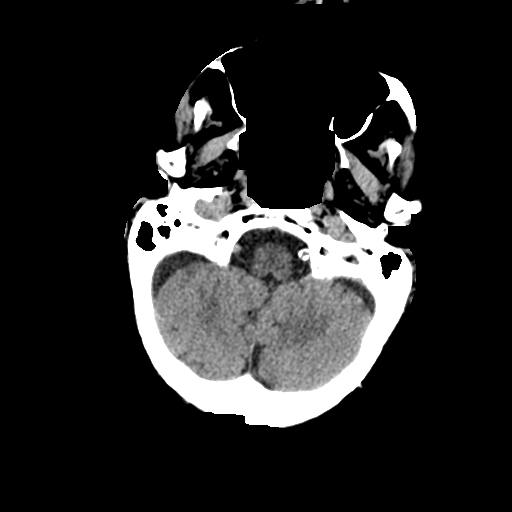
[im 9/33  brain]
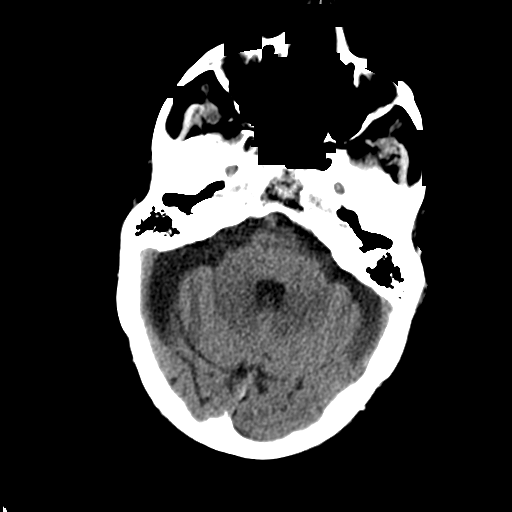
[im 12/33  brain]
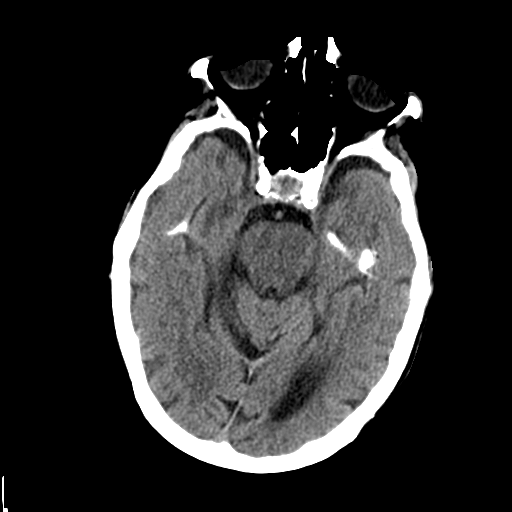
[im 15/33  brain]
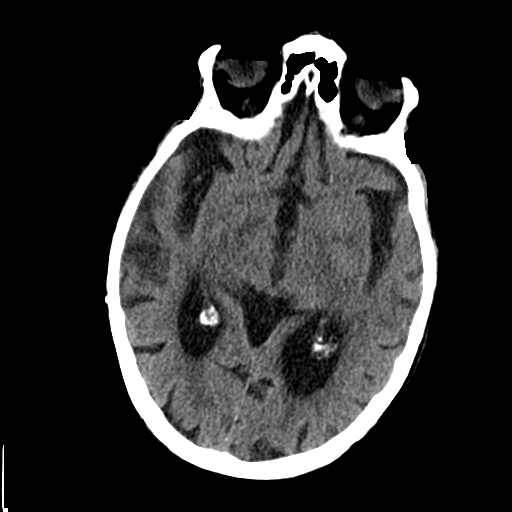
[im 15/33  bone]
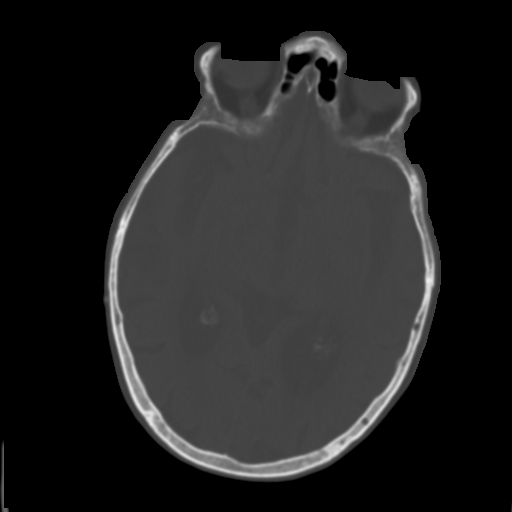
[im 18/33  brain]
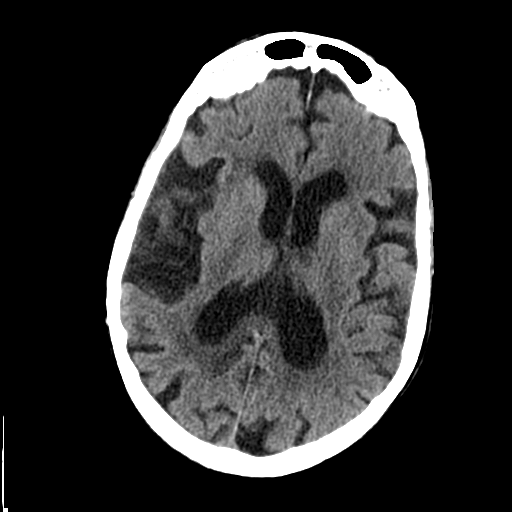
[im 21/33  brain]
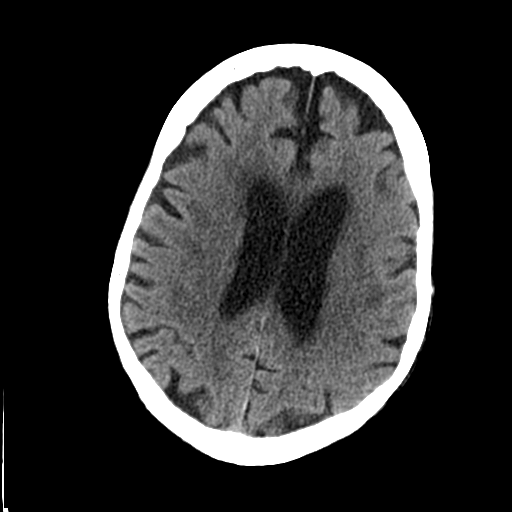
[im 25/33  brain]
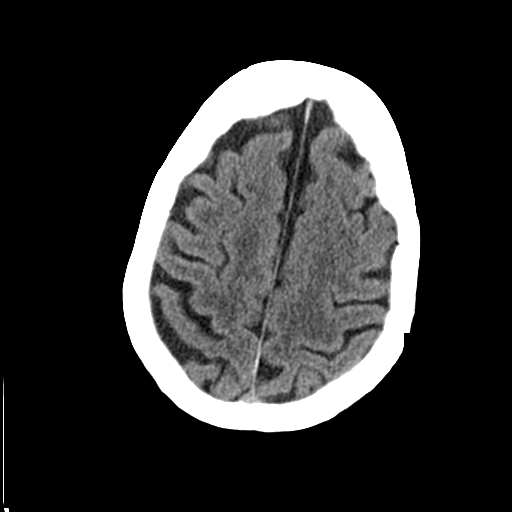
[im 27/33  brain]
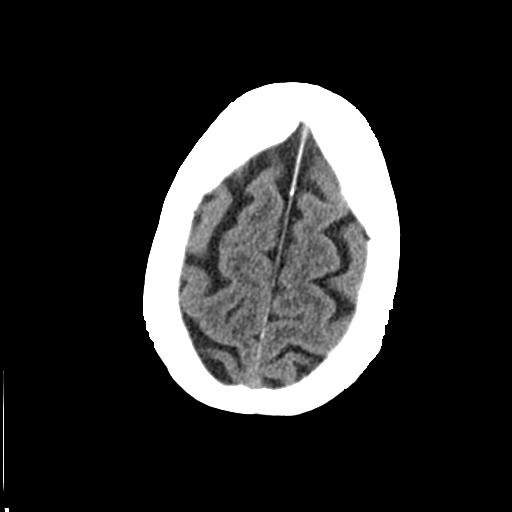
[im 27/33  bone]
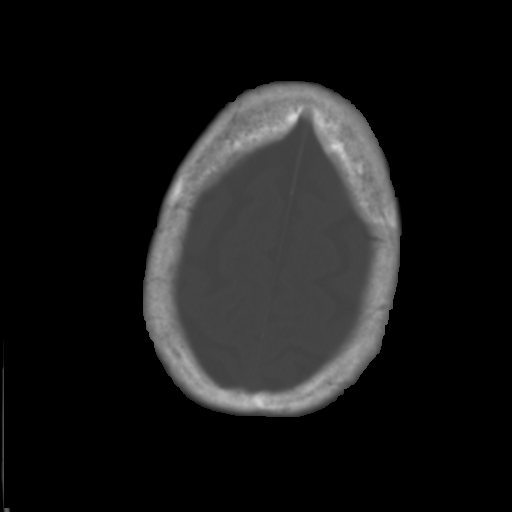
[im 30/33  brain]
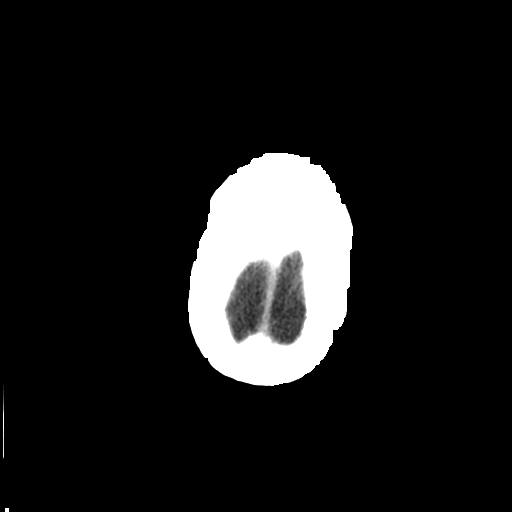

[Series 4: coronal soft · coronal · 0.34mm/px · 3 of 84 slices shown]
[im 28/84  brain]
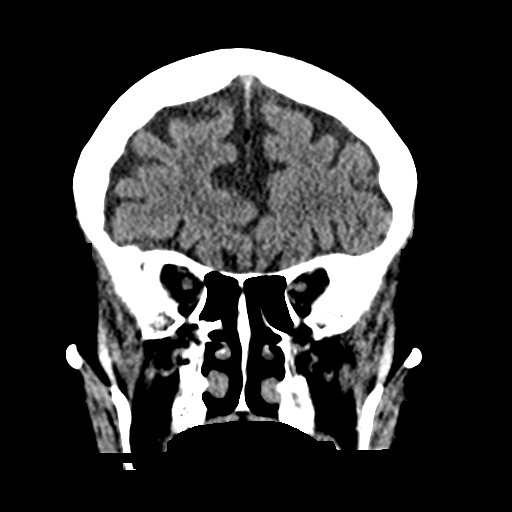
[im 37/84  brain]
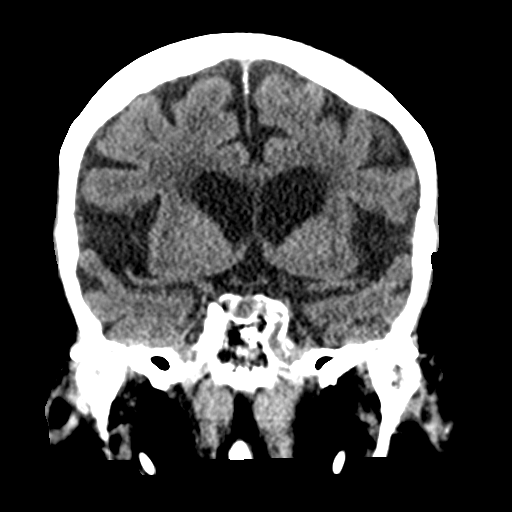
[im 47/84  brain]
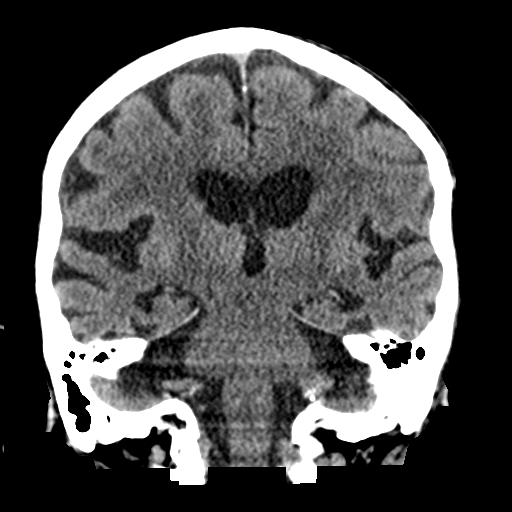

[Series 5: sagittal soft · sagittal · 0.37mm/px · 3 of 55 slices shown]
[im 19/55  brain]
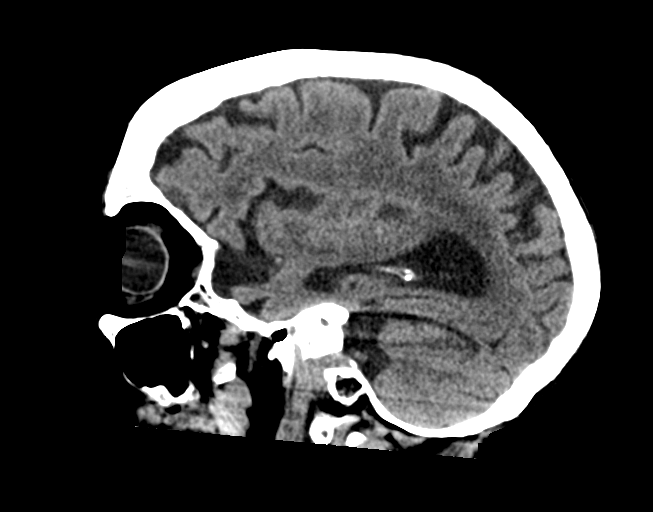
[im 28/55  brain]
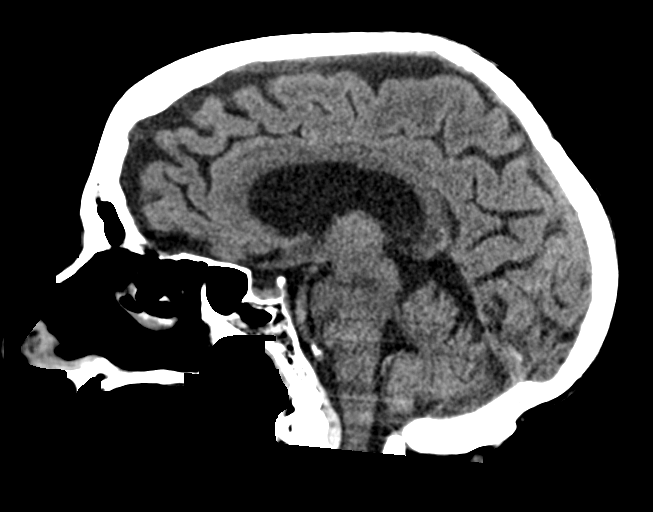
[im 37/55  brain]
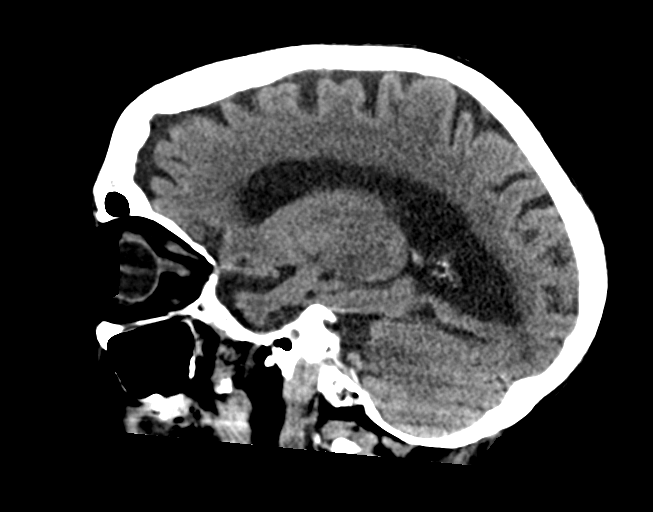

[16 of 47 positions shown; findings below may reference images not displayed]

FINDINGS: Brain: Mild diffuse cortical atrophy is noted. Mild chronic ischemic
white matter disease is noted. No mass effect or midline shift is
noted. Ventricular size is within normal limits. There is no
evidence of mass lesion, hemorrhage or acute infarction.

Vascular: No hyperdense vessel or unexpected calcification.

Skull: Normal. Negative for fracture or focal lesion.

Sinuses/Orbits: No acute finding.

Other: None.
IMPRESSION: Mild diffuse cortical atrophy. Mild chronic ischemic white matter
disease. No acute intracranial abnormality seen.

## 2020-02-10 DEATH — deceased
